# Patient Record
Sex: Female | Born: 1980 | Race: Black or African American | Hispanic: No | Marital: Single | Smoking: Current every day smoker
Health system: Southern US, Community
[De-identification: ages and names within clinical notes are randomized; demographics above are authoritative.]

## PROBLEM LIST (undated history)

## (undated) ENCOUNTER — Emergency Department: Payer: Self-pay

## (undated) DIAGNOSIS — O24419 Gestational diabetes mellitus in pregnancy, unspecified control: Secondary | ICD-10-CM

## (undated) DIAGNOSIS — Z789 Other specified health status: Secondary | ICD-10-CM

## (undated) HISTORY — PX: NO PAST SURGERIES: SHX2092

## (undated) HISTORY — DX: Gestational diabetes mellitus in pregnancy, unspecified control: O24.419

---

## 2004-03-29 ENCOUNTER — Emergency Department: Payer: Self-pay | Admitting: Emergency Medicine

## 2006-03-27 ENCOUNTER — Observation Stay: Payer: Self-pay | Admitting: Obstetrics and Gynecology

## 2006-05-12 ENCOUNTER — Observation Stay: Payer: Self-pay

## 2006-05-30 ENCOUNTER — Inpatient Hospital Stay: Payer: Self-pay | Admitting: Obstetrics and Gynecology

## 2012-01-18 ENCOUNTER — Emergency Department: Payer: Self-pay | Admitting: Emergency Medicine

## 2012-11-25 ENCOUNTER — Emergency Department: Payer: Self-pay | Admitting: Emergency Medicine

## 2012-11-25 LAB — URINALYSIS, COMPLETE
Bilirubin,UR: NEGATIVE
Glucose,UR: NEGATIVE mg/dL (ref 0–75)
Leukocyte Esterase: NEGATIVE
Ph: 5 (ref 4.5–8.0)
Specific Gravity: 1.023 (ref 1.003–1.030)

## 2012-11-25 LAB — CBC
HCT: 35.2 % (ref 35.0–47.0)
HGB: 11.7 g/dL — ABNORMAL LOW (ref 12.0–16.0)
MCH: 29.9 pg (ref 26.0–34.0)
MCHC: 33.1 g/dL (ref 32.0–36.0)
MCV: 90 fL (ref 80–100)
RBC: 3.9 10*6/uL (ref 3.80–5.20)

## 2013-04-29 ENCOUNTER — Emergency Department: Payer: Self-pay | Admitting: Emergency Medicine

## 2013-09-16 ENCOUNTER — Emergency Department: Payer: Self-pay | Admitting: Emergency Medicine

## 2013-11-21 ENCOUNTER — Emergency Department: Payer: Self-pay | Admitting: Emergency Medicine

## 2015-01-19 ENCOUNTER — Emergency Department: Payer: Medicaid Other

## 2015-01-19 ENCOUNTER — Encounter: Payer: Self-pay | Admitting: Emergency Medicine

## 2015-01-19 ENCOUNTER — Emergency Department
Admission: EM | Admit: 2015-01-19 | Discharge: 2015-01-19 | Disposition: A | Discharge status: Home / Self Care | Payer: Medicaid Other | Attending: Emergency Medicine | Admitting: Emergency Medicine

## 2015-01-19 DIAGNOSIS — Z720 Tobacco use: Secondary | ICD-10-CM | POA: Insufficient documentation

## 2015-01-19 DIAGNOSIS — M25572 Pain in left ankle and joints of left foot: Secondary | ICD-10-CM | POA: Diagnosis present

## 2015-01-19 DIAGNOSIS — M722 Plantar fascial fibromatosis: Secondary | ICD-10-CM

## 2015-01-19 DIAGNOSIS — M2141 Flat foot [pes planus] (acquired), right foot: Secondary | ICD-10-CM | POA: Insufficient documentation

## 2015-01-19 DIAGNOSIS — M2142 Flat foot [pes planus] (acquired), left foot: Secondary | ICD-10-CM | POA: Insufficient documentation

## 2015-01-19 DIAGNOSIS — L309 Dermatitis, unspecified: Secondary | ICD-10-CM | POA: Insufficient documentation

## 2015-01-19 MED ORDER — TRAMADOL HCL 50 MG PO TABS: 50.0000 mg | ORAL_TABLET | Freq: Four times a day (QID) | ORAL | Status: DC | PRN

## 2015-01-19 MED ORDER — NAPROXEN SODIUM 275 MG PO TABS: 275.0000 mg | ORAL_TABLET | Freq: Two times a day (BID) | ORAL | Status: AC

## 2015-01-19 NOTE — ED Notes (Signed)
Pt presents with left foot pain since Friday.

## 2015-01-19 NOTE — ED Provider Notes (Signed)
[facility] Emergency Department Provider Note  ____________________________________________  Time seen: Approximately 12:28 PM  I have reviewed the triage vital signs and the nursing notes.   HISTORY  Chief Complaint Foot Pain    HPI Shannon Ochoa is a 34 y.o. female 4 days of left foot pain.Patient denies any provocative incident. Patient states she's been soaking the foot in Epsom salt with mild relief. Patient states the pain is isn't 2 areas of of foot. Plantar heel pain and dorsal foot pain. Patient stated pain increases with ambulation.   History reviewed. No pertinent past medical history.  There are no active problems to display for this patient.   History reviewed. No pertinent past surgical history.  No current outpatient prescriptions on file.  Allergies Review of patient's allergies indicates no known allergies.  No family history on file.  Social History Social History  Substance Use Topics  . Smoking status: Current Some Day Smoker  . Smokeless tobacco: None  . Alcohol Use: No    Review of Systems Constitutional: No fever/chills Eyes: No visual changes. ENT: No sore throat. Cardiovascular: Denies chest pain. Respiratory: Denies shortness of breath. Gastrointestinal: No abdominal pain.  No nausea, no vomiting.  No diarrhea.  No constipation. Genitourinary: Negative for dysuria. Musculoskeletal: Left foot pain Skin: Negative for rash. Eczema left medial aspect of left foot Neurological: Negative for headaches, focal weakness or numbness. 10-point ROS otherwise negative.  ____________________________________________   PHYSICAL EXAM:  VITAL SIGNS: ED Triage Vitals  Enc Vitals Group     BP 01/19/15 1141 123/108 mmHg     Pulse Rate 01/19/15 1141 93     Resp 01/19/15 1141 20     Temp 01/19/15 1141 98.6 F (37 C)     Temp Source 01/19/15 1141 Oral     SpO2 01/19/15 1141 98 %     Weight 01/19/15 1139 202 lb (91.627  kg)     Height 01/19/15 1139 5\' 4"  (1.626 m)     Head Cir --      Peak Flow --      Pain Score 01/19/15 1140 8     Pain Loc --      Pain Edu? --      Excl. in GC? --     Constitutional: Alert and oriented. Well appearing and in no acute distress. Eyes: Conjunctivae are normal. PERRL. EOMI. Head: Atraumatic. Nose: No congestion/rhinnorhea. Mouth/Throat: Mucous membranes are moist.  Oropharynx non-erythematous. Neck: No stridor.   Cardiovascular: Normal rate, regular rhythm. Grossly normal heart sounds.  Good peripheral circulation. Mild elevation of diastolic blood pressure Respiratory: Normal respiratory effort.  No retractions. Lungs CTAB. Gastrointestinal: Soft and nontender. No distention. No abdominal bruits. No CVA tenderness. Musculoskeletal: Pes Planus. Moderate guarding with palpation plan aspect left heel and dorsal aspect of the left foot Neurologic:  Normal speech and language. No gross focal neurologic deficits are appreciated. No gait instability. Skin:  Skin is warm, dry and intact. No rash noted. Eczema lesion medial aspect the left foot Psychiatric: Mood and affect are normal. Speech and behavior are normal.  ____________________________________________   LABS (all labs ordered are listed, but only abnormal results are displayed)  Labs Reviewed - No data to display ____________________________________________  EKG   ____________________________________________  RADIOLOGY  Acute findings on x-ray. I, Joni Reining, personally viewed and evaluated these images (plain radiographs) as part of my medical decision making.   ____________________________________________   PROCEDURES  Procedure(s) performed: None  Critical Care  performed: No  ____________________________________________   INITIAL IMPRESSION / ASSESSMENT AND PLAN / ED COURSE  Pertinent labs & imaging results that were available during my care of the patient were reviewed by me and  considered in my medical decision making (see chart for details).  Plantar fasciitis. Patient advised to follow-up with podiatry for further evaluation and treatment. ____________________________________________   FINAL CLINICAL IMPRESSION(S) / ED DIAGNOSES  Final diagnoses:  Pes planus of both feet  Plantar fasciitis of left foot      Joni Reining, PA-C 01/19/15 1402  Arnaldo Natal, MD 01/19/15 1505

## 2015-01-19 NOTE — Discharge Instructions (Signed)
Plantar Fasciitis  Plantar fasciitis is a common condition that causes foot pain. It is soreness (inflammation) of the band of tough fibrous tissue on the bottom of the foot that runs from the heel bone (calcaneus) to the ball of the foot. The cause of this soreness may be from excessive standing, poor fitting shoes, running on hard surfaces, being overweight, having an abnormal walk, or overuse (this is common in runners) of the painful foot or feet. It is also common in aerobic exercise dancers and ballet dancers.  SYMPTOMS   Most people with plantar fasciitis complain of:   Severe pain in the morning on the bottom of their foot especially when taking the first steps out of bed. This pain recedes after a few minutes of walking.   Severe pain is experienced also during walking following a long period of inactivity.   Pain is worse when walking barefoot or up stairs  DIAGNOSIS    Your caregiver will diagnose this condition by examining and feeling your foot.   Special tests such as X-rays of your foot, are usually not needed.  PREVENTION    Consult a sports medicine professional before beginning a new exercise program.   Walking programs offer a good workout. With walking there is a lower chance of overuse injuries common to runners. There is less impact and less jarring of the joints.   Begin all new exercise programs slowly. If problems or pain develop, decrease the amount of time or distance until you are at a comfortable level.   Wear good shoes and replace them regularly.   Stretch your foot and the heel cords at the back of the ankle (Achilles tendon) both before and after exercise.   Run or exercise on even surfaces that are not hard. For example, asphalt is better than pavement.   Do not run barefoot on hard surfaces.   If using a treadmill, vary the incline.   Do not continue to workout if you have foot or joint problems. Seek professional help if they do not improve.  HOME CARE INSTRUCTIONS     Avoid activities that cause you pain until you recover.   Use ice or cold packs on the problem or painful areas after working out.   Only take over-the-counter or prescription medicines for pain, discomfort, or fever as directed by your caregiver.   Soft shoe inserts or athletic shoes with air or gel sole cushions may be helpful.   If problems continue or become more severe, consult a sports medicine caregiver or your own health care provider. Cortisone is a potent anti-inflammatory medication that may be injected into the painful area. You can discuss this treatment with your caregiver.  MAKE SURE YOU:    Understand these instructions.   Will watch your condition.   Will get help right away if you are not doing well or get worse.  Document Released: 01/17/2001 Document Revised: 07/17/2011 Document Reviewed: 03/18/2008  ExitCare Patient Information 2015 [employer]. This information is not intended to replace advice given to you by your health care provider. Make sure you discuss any questions you have with your health care provider.

## 2015-05-25 ENCOUNTER — Emergency Department
Admission: EM | Admit: 2015-05-25 | Discharge: 2015-05-25 | Disposition: A | Discharge status: Home / Self Care | Payer: Medicaid Other | Attending: Emergency Medicine | Admitting: Emergency Medicine

## 2015-05-25 DIAGNOSIS — F17200 Nicotine dependence, unspecified, uncomplicated: Secondary | ICD-10-CM | POA: Insufficient documentation

## 2015-05-25 DIAGNOSIS — R51 Headache: Secondary | ICD-10-CM | POA: Diagnosis present

## 2015-05-25 DIAGNOSIS — Z791 Long term (current) use of non-steroidal anti-inflammatories (NSAID): Secondary | ICD-10-CM | POA: Diagnosis not present

## 2015-05-25 DIAGNOSIS — I10 Essential (primary) hypertension: Secondary | ICD-10-CM | POA: Diagnosis not present

## 2015-05-25 LAB — COMPREHENSIVE METABOLIC PANEL
ALBUMIN: 4.1 g/dL (ref 3.5–5.0)
ALK PHOS: 50 U/L (ref 38–126)
ALT: 19 U/L (ref 14–54)
AST: 16 U/L (ref 15–41)
Anion gap: 5 (ref 5–15)
BUN: 9 mg/dL (ref 6–20)
CALCIUM: 8.8 mg/dL — AB (ref 8.9–10.3)
CO2: 27 mmol/L (ref 22–32)
Chloride: 107 mmol/L (ref 101–111)
Creatinine, Ser: 0.53 mg/dL (ref 0.44–1.00)
GFR calc Af Amer: >60 mL/min (ref >60)
GFR calc non Af Amer: >60 mL/min (ref >60)
GLUCOSE: 98 mg/dL (ref 65–99)
Potassium: 4 mmol/L (ref 3.5–5.1)
Sodium: 139 mmol/L (ref 135–145)
Total Bilirubin: 0.5 mg/dL (ref 0.3–1.2)
Total Protein: 7.3 g/dL (ref 6.5–8.1)

## 2015-05-25 LAB — URINALYSIS COMPLETE WITH MICROSCOPIC (ARMC ONLY)
BILIRUBIN URINE: NEGATIVE
GLUCOSE, UA: NEGATIVE mg/dL
Ketones, ur: NEGATIVE mg/dL
Nitrite: NEGATIVE
Protein, ur: NEGATIVE mg/dL
Specific Gravity, Urine: 1.017 (ref 1.005–1.030)
pH: 5 (ref 5.0–8.0)

## 2015-05-25 LAB — CBC WITH DIFFERENTIAL/PLATELET
BASOS ABS: 0.1 10*3/uL (ref 0–0.1)
Basophils Relative: 1 %
Eosinophils Absolute: 0.1 10*3/uL (ref 0–0.7)
Eosinophils Relative: 2 %
HEMATOCRIT: 36.9 % (ref 35.0–47.0)
Hemoglobin: 12 g/dL (ref 12.0–16.0)
LYMPHS ABS: 2.6 10*3/uL (ref 1.0–3.6)
LYMPHS PCT: 29 %
MCH: 29.7 pg (ref 26.0–34.0)
MCHC: 32.6 g/dL (ref 32.0–36.0)
MCV: 91.1 fL (ref 80.0–100.0)
MONO ABS: 0.5 10*3/uL (ref 0.2–0.9)
Monocytes Relative: 5 %
NEUTROS ABS: 5.9 10*3/uL (ref 1.4–6.5)
Neutrophils Relative %: 63 %
Platelets: 358 10*3/uL (ref 150–440)
RBC: 4.05 MIL/uL (ref 3.80–5.20)
RDW: 14 % (ref 11.5–14.5)
WBC: 9.2 10*3/uL (ref 3.6–11.0)

## 2015-05-25 MED ORDER — HYDROCHLOROTHIAZIDE 12.5 MG PO CAPS: 26.0000 mg | ORAL_CAPSULE | Freq: Every day | ORAL | Status: DC

## 2015-05-25 NOTE — ED Notes (Signed)
Pt reports went to the health department today to get her Depo shot and was told to come to the ED to get medication for high blood pressure. Pt BP at health department was 157/127. Pt reports has a slight HA but denies all other sx's. Reports no hx of HTN herself but has family hx of it.

## 2015-05-25 NOTE — Discharge Instructions (Signed)
Advised to have blood pressure taken twice a day for 3 days 1 week after starting medication. How to Take Your Blood Pressure HOW DO I GET A BLOOD PRESSURE MACHINE?  You can buy an electronic home blood pressure machine at your local pharmacy. Insurance will sometimes cover the cost if you have a prescription.  Ask your doctor what type of machine is best for you. There are different machines for your arm and your wrist.  If you decide to buy a machine to check your blood pressure on your arm, first check the size of your arm so you can buy the right size cuff. To check the size of your arm:   Use a measuring tape that shows both inches and centimeters.   Wrap the measuring tape around the upper-middle part of your arm. You may need someone to help you measure.   Write down your arm measurement in both inches and centimeters.   To measure your blood pressure correctly, it is important to have the right size cuff.   If your arm is up to 13 inches (up to 34 centimeters), get an adult cuff size.  If your arm is 13 to 17 inches (35 to 44 centimeters), get a large adult cuff size.    If your arm is 17 to 20 inches (45 to 52 centimeters), get an adult thigh cuff.  WHAT DO THE NUMBERS MEAN?   There are two numbers that make up your blood pressure. For example: 120/80.  The first number (120 in our example) is called the "systolic pressure." It is a measure of the pressure in your blood vessels when your heart is pumping blood.  The second number (80 in our example) is called the "diastolic pressure." It is a measure of the pressure in your blood vessels when your heart is resting between beats.  Your doctor will tell you what your blood pressure should be. WHAT SHOULD I DO BEFORE I CHECK MY BLOOD PRESSURE?   Try to rest or relax for at least 30 minutes before you check your blood pressure.  Do not smoke.  Do not have any drinks with caffeine, such  as:  Soda.  Coffee.  Tea.  Check your blood pressure in a quiet room.  Sit down and stretch out your arm on a table. Keep your arm at about the level of your heart. Let your arm relax.  Make sure that your legs are not crossed. HOW DO I CHECK MY BLOOD PRESSURE?  Follow the directions that came with your machine.  Make sure you remove any tight-fitting clothing from your arm or wrist. Wrap the cuff around your upper arm or wrist. You should be able to fit a finger between the cuff and your arm. If you cannot fit a finger between the cuff and your arm, it is too tight and should be removed and rewrapped.  Some units require you to manually pump up the arm cuff.  Automatic units inflate the cuff when you press a button.  Cuff deflation is automatic in both models.  After the cuff is inflated, the unit measures your blood pressure and pulse. The readings are shown on a monitor. Hold still and breathe normally while the cuff is inflated.  Getting a reading takes less than a minute.  Some models store readings in a memory. Some provide a printout of readings. If your machine does not store your readings, keep a written record.  Take readings with you to your  next visit with your doctor.   This information is not intended to replace advice given to you by your health care provider. Make sure you discuss any questions you have with your health care provider.   Document Released: 04/06/2008 Document Revised: 05/15/2014 Document Reviewed: 06/19/2013 Elsevier Interactive Patient Education 2016 [employer].  Hypertension Hypertension, commonly called high blood pressure, is when the force of blood pumping through your arteries is too strong. Your arteries are the blood vessels that carry blood from your heart throughout your body. A blood pressure reading consists of a higher number over a lower number, such as 110/72. The higher number (systolic) is the pressure inside your arteries  when your heart pumps. The lower number (diastolic) is the pressure inside your arteries when your heart relaxes. Ideally you want your blood pressure below 120/80. Hypertension forces your heart to work harder to pump blood. Your arteries may become narrow or stiff. Having untreated or uncontrolled hypertension can cause heart attack, stroke, kidney disease, and other problems. RISK FACTORS Some risk factors for high blood pressure are controllable. Others are not.  Risk factors you cannot control include:   Race. You may be at higher risk if you are African American.  Age. Risk increases with age.  Gender. Men are at higher risk than women before age 74 years. After age 79, women are at higher risk than men. Risk factors you can control include:  Not getting enough exercise or physical activity.  Being overweight.  Getting too much fat, sugar, calories, or salt in your diet.  Drinking too much alcohol. SIGNS AND SYMPTOMS Hypertension does not usually cause signs or symptoms. Extremely high blood pressure (hypertensive crisis) may cause headache, anxiety, shortness of breath, and nosebleed. DIAGNOSIS To check if you have hypertension, your health care provider will measure your blood pressure while you are seated, with your arm held at the level of your heart. It should be measured at least twice using the same arm. Certain conditions can cause a difference in blood pressure between your right and left arms. A blood pressure reading that is higher than normal on one occasion does not mean that you need treatment. If it is not clear whether you have high blood pressure, you may be asked to return on a different day to have your blood pressure checked again. Or, you may be asked to monitor your blood pressure at home for 1 or more weeks. TREATMENT Treating high blood pressure includes making lifestyle changes and possibly taking medicine. Living a healthy lifestyle can help lower high blood  pressure. You may need to change some of your habits. Lifestyle changes may include:  Following the DASH diet. This diet is high in fruits, vegetables, and whole grains. It is low in salt, red meat, and added sugars.  Keep your sodium intake below 2,300 mg per day.  Getting at least 30-45 minutes of aerobic exercise at least 4 times per week.  Losing weight if necessary.  Not smoking.  Limiting alcoholic beverages.  Learning ways to reduce stress. Your health care provider may prescribe medicine if lifestyle changes are not enough to get your blood pressure under control, and if one of the following is true:  You are 72-70 years of age and your systolic blood pressure is above 140.  You are 53 years of age or older, and your systolic blood pressure is above 150.  Your diastolic blood pressure is above 90.  You have diabetes, and your systolic blood  pressure is over 140 or your diastolic blood pressure is over 90.  You have kidney disease and your blood pressure is above 140/90.  You have heart disease and your blood pressure is above 140/90. Your personal target blood pressure may vary depending on your medical conditions, your age, and other factors. HOME CARE INSTRUCTIONS  Have your blood pressure rechecked as directed by your health care provider.   Take medicines only as directed by your health care provider. Follow the directions carefully. Blood pressure medicines must be taken as prescribed. The medicine does not work as well when you skip doses. Skipping doses also puts you at risk for problems.  Do not smoke.   Monitor your blood pressure at home as directed by your health care provider. SEEK MEDICAL CARE IF:   You think you are having a reaction to medicines taken.  You have recurrent headaches or feel dizzy.  You have swelling in your ankles.  You have trouble with your vision. SEEK IMMEDIATE MEDICAL CARE IF:  You develop a severe headache or  confusion.  You have unusual weakness, numbness, or feel faint.  You have severe chest or abdominal pain.  You vomit repeatedly.  You have trouble breathing. MAKE SURE YOU:   Understand these instructions.  Will watch your condition.  Will get help right away if you are not doing well or get worse.   This information is not intended to replace advice given to you by your health care provider. Make sure you discuss any questions you have with your health care provider.   Document Released: 04/24/2005 Document Revised: 09/08/2014 Document Reviewed: 02/14/2013 Elsevier Interactive Patient Education [employer]

## 2015-05-25 NOTE — ED Notes (Signed)
States she went to health dept for depo shot   Noticed that her b/p was elevated   Hx of same during her pregnancies  Denies any sx's

## 2015-05-25 NOTE — ED Provider Notes (Signed)
[facility] Emergency Department Provider Note  ____________________________________________  Time seen: Approximately 10:06 AM  I have reviewed the triage vital signs and the nursing notes.   HISTORY  Chief Complaint Hypertension    HPI Shannon Ochoa is a 35 y.o. African American female presenting to the ED today with complaint of high BP after she went to health department this morning for her Depo shot. She reports they gave her the shot but told her she needed to be seen by a doctor today because her BP was too high at 157/127 and again taken at 167/127. She has a history of gestational hypertension with two pregnancies and significant family history of HTN. She states the health department has told her for a couple of years her BP was too high but was not hypertensive when seen by her PCP. She is a current "some day" smoker with a BMI of 39. States headache for a couple of months now and occasional dizziness. Denies blurry vision, CP, palpitations, SOB, DOE, or edema.   No past medical history on file.  There are no active problems to display for this patient.   No past surgical history on file.  Current Outpatient Rx  Name  Route  Sig  Dispense  Refill  . hydrochlorothiazide (MICROZIDE) 12.5 MG capsule   Oral   Take 2 capsules (25 mg total) by mouth daily.   60 capsule   0   . naproxen sodium (ANAPROX) 275 MG tablet   Oral   Take 1 tablet (275 mg total) by mouth 2 (two) times daily with a meal.   30 tablet   2   . traMADol (ULTRAM) 50 MG tablet   Oral   Take 1 tablet (50 mg total) by mouth every 6 (six) hours as needed for moderate pain.   12 tablet   0     Allergies Review of patient's allergies indicates no known allergies.  No family history on file.  Social History Social History  Substance Use Topics  . Smoking status: Current Some Day Smoker  . Smokeless tobacco: Not on file  . Alcohol Use: No    Review of  Systems  Constitutional: occasional dizziness Eyes: No visual changes or blurry vision ENT: No rhinorrhea or sore throat Cardiovascular: Denies chest pain, palpitations, DOE, leg edema Respiratory: Denies shortness of breath. Gastrointestinal: No abdominal pain.  No nausea, no vomiting.  No diarrhea.  No constipation. Genitourinary: Negative for dysuria. Musculoskeletal: Negative for back pain. Skin: Negative for rash. Neurological: positive for occasional headaches. Negative for focal weakness or numbness. 10-point ROS otherwise negative.  ____________________________________________   PHYSICAL EXAM:  VITAL SIGNS: ED Triage Vitals  Enc Vitals Group     BP 05/25/15 0913 177/117 mmHg     Pulse Rate 05/25/15 0913 72     Resp 05/25/15 0913 20     Temp 05/25/15 0913 97.6 F (36.4 C)     Temp Source 05/25/15 0913 Oral     SpO2 05/25/15 0913 100 %     Weight 05/25/15 0913 213 lb (51.256 kg)     Height 05/25/15 0913  (1.575 m)     Head Cir --      Peak Flow --      Pain Score 05/25/15 0913 3     Pain Loc --      Pain Edu? --      Excl. in GC? --     Constitutional: Alert and oriented. Well appearing  and in no acute distress. Eyes: Conjunctivae are normal. PERRL. EOMI. Head: Atraumatic. Nose: No congestion/rhinnorhea. Mouth/Throat: Mucous membranes are moist.  Oropharynx non-erythematous. Neck: No stridor.   Cardiovascular: Normal rate, regular rhythm. Grossly normal heart sounds.  Good peripheral circulation. BP was 177/117 when triaged. Recorded one hour later at 152/114.  Respiratory: Normal respiratory effort.  No retractions. Lungs CTAB. Gastrointestinal: Soft and nontender. No distention. No abdominal bruits. No CVA tenderness. Musculoskeletal: No lower extremity tenderness nor edema.  No joint effusions. Neurologic:  Normal speech and language. No gross focal neurologic deficits are appreciated. No gait instability. Skin:  Skin is warm, dry and intact. No rash  noted. Psychiatric: Mood and affect are normal. Speech and behavior are normal.  ____________________________________________   LABS (all labs ordered are listed, but only abnormal results are displayed)  Labs Reviewed  URINALYSIS COMPLETEWITH MICROSCOPIC ([facility] ONLY) - Abnormal; Notable for the following:    Color, Urine YELLOW (*)    APPearance CLEAR (*)    Hgb urine dipstick 2+ (*)    Leukocytes, UA TRACE (*)    Bacteria, UA RARE (*)    Squamous Epithelial / LPF 0-5 (*)    All other components within normal limits  COMPREHENSIVE METABOLIC PANEL - Abnormal; Notable for the following:    Calcium 8.8 (*)    All other components within normal limits  CBC WITH DIFFERENTIAL/PLATELET   ____________________________________________  EKG   ____________________________________________  RADIOLOGY   ____________________________________________   PROCEDURES  Procedure(s) performed: None  Critical Care performed: No  ____________________________________________   INITIAL IMPRESSION / ASSESSMENT AND PLAN / ED COURSE  Pertinent labs & imaging results that were available during my care of the patient were reviewed by me and considered in my medical decision making (see chart for details).  Hypertension. Discuss lab results with patient. Patient advised to follow for family doctor for continued care. Patient will be started on HCTZ 25 mg daily pending reevaluation of blood pressure readings in one week. Advise three-day blood pressure reading in one week with 2 readings per day. ____________________________________________   FINAL CLINICAL IMPRESSION(S) / ED DIAGNOSES  Final diagnoses:  Essential hypertension      Joni Reining, PA-C 05/25/15 1148  Rockne Menghini, MD 05/25/15 [phone]

## 2016-02-10 ENCOUNTER — Encounter: Payer: Self-pay | Admitting: Emergency Medicine

## 2016-02-10 ENCOUNTER — Emergency Department
Admission: EM | Admit: 2016-02-10 | Discharge: 2016-02-10 | Disposition: A | Discharge status: Home / Self Care | Payer: Medicaid Other | Attending: Emergency Medicine | Admitting: Emergency Medicine

## 2016-02-10 DIAGNOSIS — H578 Other specified disorders of eye and adnexa: Secondary | ICD-10-CM | POA: Diagnosis present

## 2016-02-10 DIAGNOSIS — F17200 Nicotine dependence, unspecified, uncomplicated: Secondary | ICD-10-CM | POA: Diagnosis not present

## 2016-02-10 DIAGNOSIS — H00014 Hordeolum externum left upper eyelid: Secondary | ICD-10-CM | POA: Insufficient documentation

## 2016-02-10 DIAGNOSIS — Z791 Long term (current) use of non-steroidal anti-inflammatories (NSAID): Secondary | ICD-10-CM | POA: Diagnosis not present

## 2016-02-10 LAB — CBC WITH DIFFERENTIAL/PLATELET
BASOS ABS: 0.1 10*3/uL (ref 0–0.1)
BASOS PCT: 1 %
Eosinophils Absolute: 0.3 10*3/uL (ref 0–0.7)
Eosinophils Relative: 2 %
HEMATOCRIT: 41.2 % (ref 35.0–47.0)
HEMOGLOBIN: 13.4 g/dL (ref 12.0–16.0)
Lymphocytes Relative: 21 %
Lymphs Abs: 2.9 10*3/uL (ref 1.0–3.6)
MCH: 29.9 pg (ref 26.0–34.0)
MCHC: 32.6 g/dL (ref 32.0–36.0)
MCV: 91.6 fL (ref 80.0–100.0)
Monocytes Absolute: 1 10*3/uL — ABNORMAL HIGH (ref 0.2–0.9)
Monocytes Relative: 8 %
NEUTROS ABS: 9.5 10*3/uL — AB (ref 1.4–6.5)
NEUTROS PCT: 68 %
Platelets: 291 10*3/uL (ref 150–440)
RBC: 4.49 MIL/uL (ref 3.80–5.20)
RDW: 13.9 % (ref 11.5–14.5)
WBC: 13.8 10*3/uL — ABNORMAL HIGH (ref 3.6–11.0)

## 2016-02-10 MED ORDER — ERYTHROMYCIN 5 MG/GM OP OINT: TOPICAL_OINTMENT | Freq: Three times a day (TID) | OPHTHALMIC | 0 refills | Status: AC

## 2016-02-10 MED ORDER — IBUPROFEN 800 MG PO TABS
800.0000 mg | ORAL_TABLET | Freq: Once | ORAL | Status: AC
  Administered 2016-02-10: 800 mg via ORAL
  Filled 2016-02-10: qty 1

## 2016-02-10 MED ORDER — CEPHALEXIN 500 MG PO CAPS: 500.0000 mg | ORAL_CAPSULE | Freq: Four times a day (QID) | ORAL | 0 refills | Status: DC

## 2016-02-10 NOTE — ED Triage Notes (Signed)
Having swelling to left eye   States swelling to eye started last week..Marland Kitchen

## 2016-02-10 NOTE — ED Provider Notes (Signed)
[facility] Emergency Department Provider Note   ____________________________________________   First MD Initiated Contact with Patient 02/10/16 1355     (approximate)  I have reviewed the triage vital signs and the nursing notes.   HISTORY  Chief Complaint Facial Swelling   HPI Shannon Ochoa is a 35 y.o. female who presents with swelling to left eyelid x6 days. Patient reports pain in right eyelid that has worsened since onset, pain is throbbing in nature. Swelling has also worsened since onset and sometimes obstructs vision. Denies trauma, visual change, eye redness, eye drainage. Patient gets styes, but is usually able to resolve them on her own with warm compresses. Patient has tried warm compresses and epsom salts and has not been able to express any drainage. Patient also reports myalgias, high fevers and left ear pain. Patient states fevers have been up to 104 and 101. Denies nausea, vomiting, sore throat, nasal congestion, cough.    History reviewed. No pertinent past medical history.  There are no active problems to display for this patient.   History reviewed. No pertinent surgical history.  Prior to Admission medications   Medication Sig Start Date End Date Taking? Authorizing Provider  cephALEXin (KEFLEX) 500 MG capsule Take 1 capsule (500 mg total) by mouth 4 (four) times daily. 02/10/16   Charmayne Sheerharles M Beers, PA-C  erythromycin [facility](ROMYCIN) ophthalmic ointment Place into the right eye 3 (three) times daily. Place a 1/2 inch ribbon of ointment into the lower eyelid. 02/10/16 02/20/16  Charmayne Sheerharles M Beers, PA-C  hydrochlorothiazide (MICROZIDE) 12.5 MG capsule Take 2 capsules (25 mg total) by mouth daily. 05/25/15 05/24/16  Joni Reiningonald K Smith, PA-C  traMADol (ULTRAM) 50 MG tablet Take 1 tablet (50 mg total) by mouth every 6 (six) hours as needed for moderate pain. 01/19/15   Joni Reiningonald K Smith, PA-C    Allergies Review of patient's allergies indicates no known  allergies.  No family history on file.  Social History Social History  Substance Use Topics  . Smoking status: Current Some Day Smoker  . Smokeless tobacco: Never Used  . Alcohol use No    Review of Systems Constitutional: Positive for fever and fatigue.  Eyes: No visual changes, red eye, eye drainage. Swelling and pain to left eyelid. ENT: No sore throat, nasal congestion. Positive for left ear pain.  Cardiovascular: Denies chest pain. Respiratory: Denies shortness of breath. Gastrointestinal: No abdominal pain.  No nausea, no vomiting.  Musculoskeletal: Negative for neck/back pain. Skin: Negative for rash. Neurological: Negative for focal weakness or numbness. Positive for headache.  ____________________________________________   PHYSICAL EXAM:  VITAL SIGNS: ED Triage Vitals  Enc Vitals Group     BP 02/10/16 1251 120/73     Pulse Rate 02/10/16 1251 (!) 105     Resp 02/10/16 1251 18     Temp 02/10/16 1251 98.4 F (36.9 C)     Temp Source 02/10/16 1251 Oral     SpO2 02/10/16 1251 97 %     Weight --      Height --      Head Circumference --      Peak Flow --      Pain Score 02/10/16 1242 7     Pain Loc --      Pain Edu? --      Excl. in GC? --     Constitutional: Alert and oriented. Well appearing and in no acute distress. Eyes: Left upper lid with moderate swelling and erythema, mildly tender  to palpation, small punctate opening noted medially without drainage. Sclera clear without icterus bilaterally. PERRL. EOMI. No tenderness to palpation of left orbital bones.  Head: Atraumatic. Ears: Canals and TMs normal bilaterally. No tenderness on manipulation of tragus or pinna bilaterally.  Nose: No congestion/rhinnorhea. Mouth/Throat: Mucous membranes are moist.  Oropharynx non-erythematous and without exudate. No TMJ tenderness or tenderness along the left jaw. Neck: No stridor. Supple, full ROM without pain or difficulty. Hematological/Lymphatic/Immunilogical: No  cervical lymphadenopathy. Cardiovascular: Normal rate, regular rhythm. Grossly normal heart sounds.  Good peripheral circulation. Respiratory: Normal respiratory effort.  No retractions. Lungs CTAB. Musculoskeletal: Full ROM in all extremities without pain or difficulty. Neurologic:  Normal speech and language. No gross focal neurologic deficits are appreciated.  Skin:  Skin is warm, dry and intact. No rash noted. Psychiatric: Mood and affect are normal. Speech and behavior are normal.  ____________________________________________   LABS (all labs ordered are listed, but only abnormal results are displayed)  Labs Reviewed  CBC WITH DIFFERENTIAL/PLATELET - Abnormal; Notable for the following:       Result Value   WBC 13.8 (*)    Neutro Abs 9.5 (*)    Monocytes Absolute 1.0 (*)    All other components within normal limits   ____________________________________________  EKG  None. ____________________________________________  RADIOLOGY  None. ____________________________________________   PROCEDURES  Procedure(s) performed: None  Procedures  Critical Care performed: No  ____________________________________________   INITIAL IMPRESSION / ASSESSMENT AND PLAN / ED COURSE  Pertinent labs & imaging results that were available during my care of the patient were reviewed by me and considered in my medical decision making (see chart for details).  Patient presentation consistent with hordeolum of the left upper lid. Patient given 800 mg PO ibuprofen in the ED and tolerated well. Patient given prescriptions for keflex and erythromycin ophthalmic ointment. Patient to follow up with ophthalmology if symptoms worsen or fail to improve. No other emergency medicine complaints at this time.   Clinical Course     ____________________________________________   FINAL CLINICAL IMPRESSION(S) / ED DIAGNOSES  Final diagnoses:  Hordeolum externum left upper eyelid      NEW  MEDICATIONS STARTED DURING THIS VISIT:  New Prescriptions   CEPHALEXIN (KEFLEX) 500 MG CAPSULE    Take 1 capsule (500 mg total) by mouth 4 (four) times daily.   ERYTHROMYCIN (ROMYCIN) OPHTHALMIC OINTMENT    Place into the right eye 3 (three) times daily. Place a 1/2 inch ribbon of ointment into the lower eyelid.     Note:  This document was prepared using Dragon voice recognition software and may include unintentional dictation errors.   Evangeline Dakin, PA-C 02/10/16 1600    Jeanmarie Plant, MD 02/10/16 [phone]

## 2016-04-13 ENCOUNTER — Encounter: Payer: Self-pay | Admitting: *Deleted

## 2016-04-13 ENCOUNTER — Emergency Department
Admission: EM | Admit: 2016-04-13 | Discharge: 2016-04-13 | Disposition: A | Discharge status: Home / Self Care | Payer: Medicaid Other | Attending: Emergency Medicine | Admitting: Emergency Medicine

## 2016-04-13 DIAGNOSIS — Y999 Unspecified external cause status: Secondary | ICD-10-CM | POA: Diagnosis not present

## 2016-04-13 DIAGNOSIS — Z5321 Procedure and treatment not carried out due to patient leaving prior to being seen by health care provider: Secondary | ICD-10-CM | POA: Diagnosis not present

## 2016-04-13 DIAGNOSIS — F17200 Nicotine dependence, unspecified, uncomplicated: Secondary | ICD-10-CM | POA: Insufficient documentation

## 2016-04-13 DIAGNOSIS — X58XXXA Exposure to other specified factors, initial encounter: Secondary | ICD-10-CM | POA: Insufficient documentation

## 2016-04-13 DIAGNOSIS — Y939 Activity, unspecified: Secondary | ICD-10-CM | POA: Diagnosis not present

## 2016-04-13 DIAGNOSIS — Y929 Unspecified place or not applicable: Secondary | ICD-10-CM | POA: Insufficient documentation

## 2016-04-13 DIAGNOSIS — S8992XA Unspecified injury of left lower leg, initial encounter: Secondary | ICD-10-CM | POA: Diagnosis not present

## 2016-04-13 NOTE — ED Notes (Signed)
Attempted to call patient on home phone with no answer.

## 2016-04-13 NOTE — ED Triage Notes (Signed)
Pt got out of car and injured left knee

## 2016-04-13 NOTE — ED Notes (Signed)
Patient called in flex wait and main wait with no answer.

## 2016-11-18 ENCOUNTER — Emergency Department
Admission: EM | Admit: 2016-11-18 | Discharge: 2016-11-18 | Disposition: A | Discharge status: Home / Self Care | Payer: Medicaid Other | Attending: Emergency Medicine | Admitting: Emergency Medicine

## 2016-11-18 ENCOUNTER — Encounter: Payer: Self-pay | Admitting: Emergency Medicine

## 2016-11-18 DIAGNOSIS — J029 Acute pharyngitis, unspecified: Secondary | ICD-10-CM | POA: Diagnosis present

## 2016-11-18 DIAGNOSIS — N3001 Acute cystitis with hematuria: Secondary | ICD-10-CM | POA: Insufficient documentation

## 2016-11-18 DIAGNOSIS — J020 Streptococcal pharyngitis: Secondary | ICD-10-CM | POA: Insufficient documentation

## 2016-11-18 DIAGNOSIS — F17200 Nicotine dependence, unspecified, uncomplicated: Secondary | ICD-10-CM | POA: Diagnosis not present

## 2016-11-18 LAB — URINALYSIS, ROUTINE W REFLEX MICROSCOPIC
Bilirubin Urine: NEGATIVE
GLUCOSE, UA: NEGATIVE mg/dL
KETONES UR: NEGATIVE mg/dL
Nitrite: NEGATIVE
PROTEIN: 100 mg/dL — AB
Specific Gravity, Urine: 1.024 (ref 1.005–1.030)
pH: 5 (ref 5.0–8.0)

## 2016-11-18 LAB — POCT PREGNANCY, URINE: PREG TEST UR: NEGATIVE

## 2016-11-18 MED ORDER — CEPHALEXIN 500 MG PO CAPS: 500.0000 mg | ORAL_CAPSULE | Freq: Four times a day (QID) | ORAL | 0 refills | Status: AC

## 2016-11-18 MED ORDER — ACETAMINOPHEN 325 MG PO TABS
650.0000 mg | ORAL_TABLET | Freq: Once | ORAL | Status: AC
  Administered 2016-11-18: 650 mg via ORAL
  Filled 2016-11-18: qty 2

## 2016-11-18 NOTE — ED Triage Notes (Signed)
L earache, cough and sore throat since yesterday.

## 2016-11-18 NOTE — ED Provider Notes (Signed)
[facility] Emergency Department Provider Note  ____________________________________________  Time seen: Approximately 5:09 PM  I have reviewed the triage vital signs and the nursing notes.   HISTORY  Chief Complaint Otalgia    HPI Shannon Ochoa is a 36 y.o. female patient presents to the emergency department with pharyngitis  nausea, headache and anterior neck discomfort for the past 3 days. Patient secondarily reports dysuria for approximately one day. She states that she is due to start her menstrual cycle. She denies changes in vaginal discharge, bilateral flank pain, history of pyelonephritis, nausea, vomiting or abdominal pain. No alleviating measures have been attempted.  History reviewed. No pertinent past medical history.  There are no active problems to display for this patient.   History reviewed. No pertinent surgical history.  Prior to Admission medications   Medication Sig Start Date End Date Taking? Authorizing Provider  cephALEXin (KEFLEX) 500 MG capsule Take 1 capsule (500 mg total) by mouth 4 (four) times daily. 11/18/16 11/28/16  Orvil Feil, PA-C  hydrochlorothiazide (MICROZIDE) 12.5 MG capsule Take 2 capsules (25 mg total) by mouth daily. 05/25/15 05/24/16  Joni Reining, PA-C  traMADol (ULTRAM) 50 MG tablet Take 1 tablet (50 mg total) by mouth every 6 (six) hours as needed for moderate pain. 01/19/15   Joni Reining, PA-C    Allergies Patient has no known allergies.  No family history on file.  Social History Social History  Substance Use Topics  . Smoking status: Current Some Day Smoker  . Smokeless tobacco: Never Used  . Alcohol use No     Review of Systems  Constitutional: No fever/chills Eyes: No visual changes. No discharge ENT: Patient has pharyngitis and left otalgia. Cardiovascular: no chest pain. Respiratory: no cough. No SOB. Gastrointestinal: No abdominal pain.  No nausea, no vomiting.  No diarrhea.  No  constipation. Genitourinary:  Patient has dysuria. Musculoskeletal: Negative for musculoskeletal pain. Skin: Negative for rash, abrasions, lacerations, ecchymosis. Neurological: patient has headache, no focal weakness or numbness.   ____________________________________________   PHYSICAL EXAM:  VITAL SIGNS: ED Triage Vitals  Enc Vitals Group     BP 11/18/16 1541 (!) 142/84     Pulse Rate 11/18/16 1541 (!) 112     Resp 11/18/16 1541 18     Temp 11/18/16 1541 (!) 100.4 F (38 C)     Temp Source 11/18/16 1541 Oral     SpO2 11/18/16 1541 96 %     Weight 11/18/16 1542 210 lb (95.3 kg)     Height 11/18/16 1542 5\' 4"  (1.626 m)     Head Circumference --      Peak Flow --      Pain Score 11/18/16 1541 8     Pain Loc --      Pain Edu? --      Excl. in GC? --      Constitutional: Alert and oriented. Well appearing and in no acute distress. Eyes: Conjunctivae are normal. PERRL. EOMI. Head: Atraumatic. ENT:      Ears: Tympanic membranes are pearly bilaterally.      Nose: No congestion/rhinnorhea.      Mouth/Throat: Mucous membranes are moist. Posterior pharynx is erythematous with bilateral tonsillar hypertrophy and exudate. Hematological/Lymphatic/Immunilogical: Palpable cervical lymphadenopathy Cardiovascular: Normal rate, regular rhythm. Normal S1 and S2.  Good peripheral circulation. Respiratory: Normal respiratory effort without tachypnea or retractions. Lungs CTAB. Good air entry to the bases with no decreased or absent breath sounds. Gastrointestinal: Bowel sounds 4  quadrants. Soft and nontender to palpation. No guarding or rigidity. No palpable masses. No distention. No CVA tenderness. Psychiatric: Mood and affect are normal. Speech and behavior are normal. Patient exhibits appropriate insight and judgement.   ____________________________________________   LABS (all labs ordered are listed, but only abnormal results are displayed)  Labs Reviewed  URINALYSIS, ROUTINE W  REFLEX MICROSCOPIC - Abnormal; Notable for the following:       Result Value   Color, Urine AMBER (*)    APPearance CLOUDY (*)    Hgb urine dipstick MODERATE (*)    Protein, ur 100 (*)    Leukocytes, UA MODERATE (*)    Bacteria, UA RARE (*)    Squamous Epithelial / LPF 0-5 (*)    All other components within normal limits  POC URINE PREG, ED  POCT PREGNANCY, URINE   ____________________________________________  EKG   ____________________________________________  RADIOLOGY   No results found.  ____________________________________________    PROCEDURES  Procedure(s) performed:    Procedures    Medications - No data to display   ____________________________________________   INITIAL IMPRESSION / ASSESSMENT AND PLAN / ED COURSE  Pertinent labs & imaging results that were available during my care of the patient were reviewed by me and considered in my medical decision making (see chart for details).  Review of the [place] CSRS was performed in accordance of the [facility] prior to dispensing any controlled drugs.    Assessment and plan: Strep pharyngitis Acute cystitis Patient presents to the emergency department with an erythematous posterior pharynx with tonsillar exudate, palpable cervical lymphadenopathy, fever and absence of cough. History and physical exam findings are consistent with strep pharyngitis. Patient also presents to the emergency department with dysuria. Urinalysis findings are concerning for acute cystitis. As patient does not have a history of nephrolithiasis and is without CVA tenderness, CT renal stone study is not warranted at this time. Patient was discharged with Keflex. She was advised to follow up with primary care in 1 week. Patient voiced understanding. Strict return precautions were given. All patient questions were answered.    ____________________________________________  FINAL CLINICAL IMPRESSION(S) / ED DIAGNOSES  Final diagnoses:  Strep  pharyngitis  Acute cystitis with hematuria      NEW MEDICATIONS STARTED DURING THIS VISIT:  New Prescriptions   CEPHALEXIN (KEFLEX) 500 MG CAPSULE    Take 1 capsule (500 mg total) by mouth 4 (four) times daily.        This chart was dictated using voice recognition software/Dragon. Despite best efforts to proofread, errors can occur which can change the meaning. Any change was purely unintentional.    Orvil FeilWoods, Giorgia Wahler M, PA-C 11/18/16 2333    Rockne MenghiniNorman, Anne-Caroline, MD 11/21/16 [phone]

## 2016-11-18 NOTE — ED Notes (Signed)
See triage note  States she developed fever ,sore throat and ear pain couple of days ago  Unsure of what her fever was at home but is febrile on arrival  Also having some urinary freq and pain

## 2017-05-15 DIAGNOSIS — R87619 Unspecified abnormal cytological findings in specimens from cervix uteri: Secondary | ICD-10-CM | POA: Insufficient documentation

## 2017-06-13 ENCOUNTER — Ambulatory Visit: Payer: Self-pay | Attending: Oncology

## 2018-03-30 ENCOUNTER — Emergency Department: Payer: Self-pay

## 2018-03-30 ENCOUNTER — Other Ambulatory Visit: Payer: Self-pay

## 2018-03-30 ENCOUNTER — Emergency Department
Admission: EM | Admit: 2018-03-30 | Discharge: 2018-03-30 | Disposition: A | Discharge status: Home / Self Care | Payer: Self-pay | Attending: Emergency Medicine | Admitting: Emergency Medicine

## 2018-03-30 DIAGNOSIS — J069 Acute upper respiratory infection, unspecified: Secondary | ICD-10-CM

## 2018-03-30 DIAGNOSIS — R519 Headache, unspecified: Secondary | ICD-10-CM

## 2018-03-30 DIAGNOSIS — R51 Headache: Secondary | ICD-10-CM | POA: Insufficient documentation

## 2018-03-30 DIAGNOSIS — F17210 Nicotine dependence, cigarettes, uncomplicated: Secondary | ICD-10-CM | POA: Insufficient documentation

## 2018-03-30 DIAGNOSIS — Z79899 Other long term (current) drug therapy: Secondary | ICD-10-CM | POA: Insufficient documentation

## 2018-03-30 LAB — CBC WITH DIFFERENTIAL/PLATELET
Abs Immature Granulocytes: 0.05 10*3/uL (ref 0.00–0.07)
Basophils Absolute: 0.1 10*3/uL (ref 0.0–0.1)
Basophils Relative: 1 %
EOS ABS: 0 10*3/uL (ref 0.0–0.5)
EOS PCT: 0 %
HEMATOCRIT: 40.3 % (ref 36.0–46.0)
Hemoglobin: 13.2 g/dL (ref 12.0–15.0)
Immature Granulocytes: 1 %
LYMPHS ABS: 3 10*3/uL (ref 0.7–4.0)
Lymphocytes Relative: 32 %
MCH: 29.9 pg (ref 26.0–34.0)
MCHC: 32.8 g/dL (ref 30.0–36.0)
MCV: 91.2 fL (ref 80.0–100.0)
Monocytes Absolute: 0.6 10*3/uL (ref 0.1–1.0)
Monocytes Relative: 7 %
Neutro Abs: 5.7 10*3/uL (ref 1.7–7.7)
Neutrophils Relative %: 59 %
Platelets: 326 10*3/uL (ref 150–400)
RBC: 4.42 MIL/uL (ref 3.87–5.11)
RDW: 13.4 % (ref 11.5–15.5)
WBC: 9.4 10*3/uL (ref 4.0–10.5)
nRBC: 0 % (ref 0.0–0.2)

## 2018-03-30 LAB — URINALYSIS, COMPLETE (UACMP) WITH MICROSCOPIC
Bilirubin Urine: NEGATIVE
Glucose, UA: NEGATIVE mg/dL
Ketones, ur: NEGATIVE mg/dL
Leukocytes, UA: NEGATIVE
NITRITE: NEGATIVE
Protein, ur: NEGATIVE mg/dL
Specific Gravity, Urine: 1.019 (ref 1.005–1.030)
pH: 5 (ref 5.0–8.0)

## 2018-03-30 LAB — BASIC METABOLIC PANEL
Anion gap: 11 (ref 5–15)
BUN: 14 mg/dL (ref 6–20)
CALCIUM: 8.6 mg/dL — AB (ref 8.9–10.3)
CO2: 24 mmol/L (ref 22–32)
Chloride: 108 mmol/L (ref 98–111)
Creatinine, Ser: 0.62 mg/dL (ref 0.44–1.00)
GFR calc Af Amer: >60 mL/min (ref >60)
GFR calc non Af Amer: >60 mL/min (ref >60)
GLUCOSE: 116 mg/dL — AB (ref 70–99)
Potassium: 3.4 mmol/L — ABNORMAL LOW (ref 3.5–5.1)
Sodium: 143 mmol/L (ref 135–145)

## 2018-03-30 LAB — POC URINE PREG, ED: Preg Test, Ur: NEGATIVE

## 2018-03-30 MED ORDER — OXYCODONE-ACETAMINOPHEN 5-325 MG PO TABS
1.0000 | ORAL_TABLET | Freq: Once | ORAL | Status: AC
  Administered 2018-03-30: 1 via ORAL
  Filled 2018-03-30: qty 1

## 2018-03-30 MED ORDER — HYDROCHLOROTHIAZIDE 12.5 MG PO CAPS: 26.0000 mg | ORAL_CAPSULE | Freq: Every day | ORAL | 0 refills | Status: DC

## 2018-03-30 MED ORDER — PROCHLORPERAZINE MALEATE 10 MG PO TABS: 10.0000 mg | ORAL_TABLET | Freq: Four times a day (QID) | ORAL | 0 refills | Status: DC | PRN

## 2018-03-30 MED ORDER — SODIUM CHLORIDE 0.9 % IV BOLUS
1000.0000 mL | Freq: Once | INTRAVENOUS | Status: AC
  Administered 2018-03-30: 1000 mL via INTRAVENOUS

## 2018-03-30 NOTE — ED Triage Notes (Signed)
Pt states "I think my BP is high." state dizzy x few days. States worse today. Denies taking meds for BP. C/o congestion and eyes hurting and HA. A&O, ambulatory.

## 2018-03-30 NOTE — ED Notes (Signed)
Pt alert and oriented X4, active, cooperative, pt in NAD. RR even and unlabored, color WNL.  Pt informed to return if any life threatening symptoms occur.  Discharge and followup instructions reviewed. Ambulates safely. 

## 2018-03-30 NOTE — Discharge Instructions (Signed)
Drink plenty of fluids, at this time, your CT scan is negative.  You would prefer not to have a lumbar puncture, which is certainly your choice but to some extent limits her ability to further work-up her headache here.  If you have severe headache persistent vomiting or any worsening symptoms including fever, stiff neck, seizure, or you feel worse in any way including worsening headache return to the emergency department.  Otherwise, please follow-up closely with your primary care doctor.  Blood pressure is somewhat elevated here however it is lower than it has been in the past.  We will restart the blood pressure medication used to be on but we do strongly recommend that you follow closely with primary care in the next day or 2.  Take over-the-counter medications as prescribed if you have ongoing discomfort.  The nausea medication should help your headache and your nausea.

## 2018-03-30 NOTE — ED Provider Notes (Addendum)
[facility] Emergency Department Provider Note  ____________________________________________   I have reviewed the triage vital signs and the nursing notes. Where available I have reviewed prior notes and, if possible and indicated, outside hospital notes.    HISTORY  Chief Complaint Headache    HPI Shannon Ochoa is a 37 y.o. female who is healthy, patient states that she has been having mild headaches, nasal congestion and feeling generally poor with some nausea for the last couple days.  Gradual onset.  No fever no chills.  Taking Tylenol with minimal relief, headache is a frontal/facial headache.  She denies any fever chills or stiff neck.  She denies any actual vomiting.  She states she does not normally get headaches.  She states that this is atypical for her.  It was not thunderclap however and not the worst headache of life.  I enter the room, patient is actually eating a [employer] breakfast but she states that it is difficult for her to eat because she feels some nausea.    History reviewed. No pertinent past medical history.  There are no active problems to display for this patient.   History reviewed. No pertinent surgical history.  Prior to Admission medications   Medication Sig Start Date End Date Taking? Authorizing Provider  hydrochlorothiazide (MICROZIDE) 12.5 MG capsule Take 2 capsules (25 mg total) by mouth daily. 05/25/15 05/24/16  Joni Reining, PA-C  traMADol (ULTRAM) 50 MG tablet Take 1 tablet (50 mg total) by mouth every 6 (six) hours as needed for moderate pain. 01/19/15   Joni Reining, PA-C    Allergies Patient has no known allergies.  History reviewed. No pertinent family history.  Social History Social History   Tobacco Use  . Smoking status: Current Some Day Smoker  . Smokeless tobacco: Never Used  Substance Use Topics  . Alcohol use: No  . Drug use: Not on file    Review of Systems Constitutional: No  fever/chills Eyes: No visual changes. ENT: No sore throat. No stiff neck no neck pain Cardiovascular: Denies chest pain. Respiratory: Denies shortness of breath. Gastrointestinal:   no vomiting.  No diarrhea.  No constipation. Genitourinary: Negative for dysuria. Musculoskeletal: Negative lower extremity swelling Skin: Negative for rash. Neurological: Negative for severe headaches, focal weakness or numbness.   ____________________________________________   PHYSICAL EXAM:  VITAL SIGNS: ED Triage Vitals [03/30/18 0901]  Enc Vitals Group     BP (!) 150/99     Pulse Rate 100     Resp 16     Temp 98.1 F (36.7 C)     Temp Source Oral     SpO2 97 %     Weight 180 lb (81.6 kg)     Height 5\' 2"  (1.575 m)     Head Circumference      Peak Flow      Pain Score 9     Pain Loc      Pain Edu?      Excl. in GC?     Constitutional: Alert and oriented. Well appearing and in no acute distress.  Eating [employer] and looking at her cell phone Eyes: Conjunctivae are normal Head: Atraumatic HEENT: + clear congestion/rhinnorhea. Mucous membranes are moist.  Oropharynx non-erythematous Neck:   Nontender with no meningismus, no masses, no stridor Cardiovascular: Normal rate, regular rhythm. Grossly normal heart sounds.  Good peripheral circulation. Respiratory: Normal respiratory effort.  No retractions. Lungs CTAB. Abdominal: Soft and nontender. No distention. No guarding no  rebound Back:  There is no focal tenderness or step off.  there is no midline tenderness there are no lesions noted. there is no CVA tenderness Musculoskeletal: No lower extremity tenderness, no upper extremity tenderness. No joint effusions, no DVT signs strong distal pulses no edema Neurologic: Cranial nerves II through XII are grossly intact 5 out of 5 strength bilateral upper and lower extremity. Finger to nose within normal limits heel to shin within normal limits, speech is normal with no word finding difficulty or  dysarthria, reflexes symmetric, pupils are equally round and reactive to light, there is no pronator drift, sensation is normal, vision is intact to confrontation, gait is deferred, there is no nystagmus, normal neurologic exam Skin:  Skin is warm, dry and intact. No rash noted. Psychiatric: Mood and affect are normal. Speech and behavior are normal.  ____________________________________________   LABS (all labs ordered are listed, but only abnormal results are displayed)  Labs Reviewed  BASIC METABOLIC PANEL - Abnormal; Notable for the following components:      Result Value   Potassium 3.4 (*)    Glucose, Bld 116 (*)    Calcium 8.6 (*)    All other components within normal limits  CBC WITH DIFFERENTIAL/PLATELET  URINALYSIS, COMPLETE (UACMP) WITH MICROSCOPIC  POC URINE PREG, ED    Pertinent labs  results that were available during my care of the patient were reviewed by me and considered in my medical decision making (see chart for details). ____________________________________________  EKG  I personally interpreted any EKGs ordered by me or triage  ____________________________________________  RADIOLOGY  Pertinent labs & imaging results that were available during my care of the patient were reviewed by me and considered in my medical decision making (see chart for details). If possible, patient and/or family made aware of any abnormal findings.  No results found. ____________________________________________    PROCEDURES  Procedure(s) performed: None  Procedures  Critical Care performed: None  ____________________________________________   INITIAL IMPRESSION / ASSESSMENT AND PLAN / ED COURSE  Pertinent labs & imaging results that were available during my care of the patient were reviewed by me and considered in my medical decision making (see chart for details).  Patient with very reassuring exam no meningismus, she is complaining of a very atypical headache  for her but is in the context of sinus pressure and seems to be somewhat elicitable.  Given her somewhat elevated blood pressure and what she described as an atypical I will do a CT scan although have low suspicion for bleed and even less for meningitis.  That is negative, is my hope that we get her feeling clinically better and get her home  ----------------------------------------- 11:52 AM on 03/30/2018 -----------------------------------------  Patient texting on her cell phone in no acute distress, laughing and joking with me and family, very well-appearing work-up is reassuring here.  I have explained that the only real thing I can do at this point to further evaluate her headache emergently would be to do a lumbar puncture, I discussed the risk benefits alternative of lumbar puncture as well as the risk benefits and alternatives of not doing a lumbar puncture.  Patient strongly feels that she does not want that procedure done.  She does understand that without doing that there is a limited what I can do in terms of working up her headache any further.  She states her headache is nearly gone she is eating and drinking in the room and she looks quite well.  Think  this is a hypertensive crisis, very slight headache in the context of congestion with minimally elevated blood pressure.  Patient states that routinely her blood pressure goes up when she is around doctors but she is been told by her primary care doctor that she does not need to be on medications and I am reluctant to change that emergently at this time.  Blood pressure is much less than it has been on multiple different visits here.    ____________________________________________   FINAL CLINICAL IMPRESSION(S) / ED DIAGNOSES  Final diagnoses:  None      This chart was dictated using voice recognition software.  Despite best efforts to proofread,  errors can occur which can change meaning.      Jeanmarie PlantMcShane, Nahomi Hegner A, MD 03/30/18  1004    Jeanmarie PlantMcShane, Jones Viviani A, MD 03/30/18 1005    Jeanmarie PlantMcShane, Nobel Brar A, MD 03/30/18 1153    Jeanmarie PlantMcShane, Haston Casebolt A, MD 03/30/18 1236

## 2018-03-30 NOTE — ED Notes (Signed)
Patient transported to CT 

## 2018-08-04 ENCOUNTER — Encounter: Payer: Self-pay | Admitting: Emergency Medicine

## 2018-08-04 ENCOUNTER — Other Ambulatory Visit: Payer: Self-pay

## 2018-08-04 ENCOUNTER — Emergency Department
Admission: EM | Admit: 2018-08-04 | Discharge: 2018-08-04 | Disposition: A | Discharge status: Home / Self Care | Payer: Self-pay | Attending: Emergency Medicine | Admitting: Emergency Medicine

## 2018-08-04 DIAGNOSIS — F17200 Nicotine dependence, unspecified, uncomplicated: Secondary | ICD-10-CM | POA: Insufficient documentation

## 2018-08-04 DIAGNOSIS — J069 Acute upper respiratory infection, unspecified: Secondary | ICD-10-CM | POA: Insufficient documentation

## 2018-08-04 MED ORDER — GUAIFENESIN-CODEINE 100-10 MG/5ML PO SOLN: 5.0000 mL | Freq: Four times a day (QID) | ORAL | 0 refills | Status: DC | PRN

## 2018-08-04 NOTE — ED Notes (Signed)
Delay in DC d/t registration

## 2018-08-04 NOTE — ED Triage Notes (Signed)
Patient reports dry cough and intermittent fever since Wednesday. Reports no relief with OTC meds.

## 2018-08-04 NOTE — ED Notes (Signed)
[phone] is pt new cell phone number

## 2018-08-04 NOTE — ED Provider Notes (Signed)
[facility] Emergency Department Provider Note  Time seen: 12:21 PM  I have reviewed the triage vital signs and the nursing notes.   HISTORY  Chief Complaint Cough and Fever   HPI Shannon Ochoa is a 38 y.o. female with no significant past medical history presents to the emergency department for 5 days of cough, fever, sneezing.  According to the patient for the past 5 days she has had cough, a dry tickle in her throat, sneezing, and states fever at home as well.  Patient states she works at [employer] and her boss wanted her to come get evaluated before returning to work.  Patient states she has not had a fever since Friday.  Denies any chest pain or abdominal pain.  No shortness of breath.   History reviewed. No pertinent past medical history.  There are no active problems to display for this patient.   History reviewed. No pertinent surgical history.  Prior to Admission medications   Medication Sig Start Date End Date Taking? Authorizing Provider  hydrochlorothiazide (MICROZIDE) 12.5 MG capsule Take 2 capsules (25 mg total) by mouth daily. 03/30/18 04/29/18  Jeanmarie Plant, MD  prochlorperazine (COMPAZINE) 10 MG tablet Take 1 tablet (10 mg total) by mouth every 6 (six) hours as needed for nausea or vomiting. 03/30/18   Jeanmarie Plant, MD  traMADol (ULTRAM) 50 MG tablet Take 1 tablet (50 mg total) by mouth every 6 (six) hours as needed for moderate pain. 01/19/15   Joni Reining, PA-C    No Known Allergies  No family history on file.  Social History Social History   Tobacco Use  . Smoking status: Current Some Day Smoker  . Smokeless tobacco: Never Used  Substance Use Topics  . Alcohol use: No  . Drug use: Not on file    Review of Systems Constitutional: Negative for fever. Cardiovascular: Negative for chest pain. Respiratory: Negative for shortness of breath.  Positive for cough.  Dry. Gastrointestinal: Negative for abdominal pain,  vomiting Musculoskeletal: Negative for musculoskeletal complaints Skin: Negative for skin complaints  Neurological: Negative for headache All other ROS negative  ____________________________________________   PHYSICAL EXAM:  VITAL SIGNS: ED Triage Vitals [08/04/18 1208]  Enc Vitals Group     BP (!) 125/91     Pulse Rate (!) 110     Resp 18     Temp 98.2 F (36.8 C)     Temp Source Oral     SpO2 99 %     Weight 203 lb (92.1 kg)     Height 5\' 2"  (1.575 m)     Head Circumference      Peak Flow      Pain Score 0     Pain Loc      Pain Edu?      Excl. in GC?    Constitutional: Alert and oriented. Well appearing and in no distress.  Occasional dry cough during exam. Eyes: Normal exam ENT   Head: Normocephalic and atraumatic.   Mouth/Throat: Mucous membranes are moist. Cardiovascular: Normal rate, regular rhythm. No murmur Respiratory: Normal respiratory effort without tachypnea nor retractions. Breath sounds are clear Gastrointestinal: Soft and nontender. No distention.  Musculoskeletal: Nontender with normal range of motion in all extremities.  Neurologic:  Normal speech and language. No gross focal neurologic deficits Skin:  Skin is warm, dry and intact.  Psychiatric: Mood and affect are normal.     INITIAL IMPRESSION / ASSESSMENT AND PLAN / ED  COURSE  Pertinent labs & imaging results that were available during my care of the patient were reviewed by me and considered in my medical decision making (see chart for details).  Patient presents to the emergency department with occasional dry cough, sneezing and fever at home over the past 5 days.  Differential would include upper respiratory infection, highly suspect viral illness.  Less likely coronavirus, no known high risk exposure, no persistent fever.  Overall the patient appears very well.  I did discuss remaining in isolation until she is 3 days free of fever which will be Monday.  We will also prescribe a cough  medication for the patient.  Better my normal upper respiratory return precautions.  Shannon Ochoa was evaluated in Emergency Department on 08/04/2018 for the symptoms described in the history of present illness. She was evaluated in the context of the global COVID-19 pandemic, which necessitated consideration that the patient might be at risk for infection with the SARS-CoV-2 virus that causes COVID-19. Institutional protocols and algorithms that pertain to the evaluation of patients at risk for COVID-19 are in a state of rapid change based on information released by regulatory bodies including the CDC and federal and state organizations. These policies and algorithms were followed during the patient's care in the ED.   ____________________________________________   FINAL CLINICAL IMPRESSION(S) / ED DIAGNOSES  Upper respiratory infection   Minna Antis, MD 08/04/18 1224

## 2018-09-18 ENCOUNTER — Other Ambulatory Visit: Payer: Self-pay

## 2018-09-18 ENCOUNTER — Emergency Department: Payer: Medicaid Other

## 2018-09-18 ENCOUNTER — Emergency Department
Admission: EM | Admit: 2018-09-18 | Discharge: 2018-09-18 | Disposition: A | Discharge status: Home / Self Care | Payer: Medicaid Other | Attending: Emergency Medicine | Admitting: Emergency Medicine

## 2018-09-18 DIAGNOSIS — Z3A24 24 weeks gestation of pregnancy: Secondary | ICD-10-CM | POA: Insufficient documentation

## 2018-09-18 DIAGNOSIS — N760 Acute vaginitis: Secondary | ICD-10-CM | POA: Diagnosis not present

## 2018-09-18 DIAGNOSIS — N3000 Acute cystitis without hematuria: Secondary | ICD-10-CM | POA: Insufficient documentation

## 2018-09-18 DIAGNOSIS — N939 Abnormal uterine and vaginal bleeding, unspecified: Secondary | ICD-10-CM | POA: Diagnosis present

## 2018-09-18 DIAGNOSIS — O9989 Other specified diseases and conditions complicating pregnancy, childbirth and the puerperium: Secondary | ICD-10-CM | POA: Insufficient documentation

## 2018-09-18 DIAGNOSIS — B9689 Other specified bacterial agents as the cause of diseases classified elsewhere: Secondary | ICD-10-CM

## 2018-09-18 DIAGNOSIS — F17200 Nicotine dependence, unspecified, uncomplicated: Secondary | ICD-10-CM | POA: Insufficient documentation

## 2018-09-18 DIAGNOSIS — N898 Other specified noninflammatory disorders of vagina: Secondary | ICD-10-CM

## 2018-09-18 DIAGNOSIS — O99332 Smoking (tobacco) complicating pregnancy, second trimester: Secondary | ICD-10-CM | POA: Diagnosis not present

## 2018-09-18 DIAGNOSIS — O26899 Other specified pregnancy related conditions, unspecified trimester: Secondary | ICD-10-CM

## 2018-09-18 LAB — URINALYSIS, COMPLETE (UACMP) WITH MICROSCOPIC
Bacteria, UA: NONE SEEN
Bilirubin Urine: NEGATIVE
Glucose, UA: NEGATIVE mg/dL
Ketones, ur: NEGATIVE mg/dL
Nitrite: NEGATIVE
Protein, ur: NEGATIVE mg/dL
Specific Gravity, Urine: 1.018 (ref 1.005–1.030)
pH: 6 (ref 5.0–8.0)

## 2018-09-18 LAB — CBC WITH DIFFERENTIAL/PLATELET
Abs Immature Granulocytes: 0.09 10*3/uL — ABNORMAL HIGH (ref 0.00–0.07)
Basophils Absolute: 0.1 10*3/uL (ref 0.0–0.1)
Basophils Relative: 0 %
Eosinophils Absolute: 0 10*3/uL (ref 0.0–0.5)
Eosinophils Relative: 0 %
HCT: 30 % — ABNORMAL LOW (ref 36.0–46.0)
Hemoglobin: 9.6 g/dL — ABNORMAL LOW (ref 12.0–15.0)
Immature Granulocytes: 1 %
Lymphocytes Relative: 17 %
Lymphs Abs: 1.9 10*3/uL (ref 0.7–4.0)
MCH: 29.9 pg (ref 26.0–34.0)
MCHC: 32 g/dL (ref 30.0–36.0)
MCV: 93.5 fL (ref 80.0–100.0)
Monocytes Absolute: 0.6 10*3/uL (ref 0.1–1.0)
Monocytes Relative: 6 %
Neutro Abs: 8.6 10*3/uL — ABNORMAL HIGH (ref 1.7–7.7)
Neutrophils Relative %: 76 %
Platelets: 272 10*3/uL (ref 150–400)
RBC: 3.21 MIL/uL — ABNORMAL LOW (ref 3.87–5.11)
RDW: 13.3 % (ref 11.5–15.5)
WBC: 11.3 10*3/uL — ABNORMAL HIGH (ref 4.0–10.5)
nRBC: 0 % (ref 0.0–0.2)

## 2018-09-18 LAB — CHLAMYDIA/NGC RT PCR (ARMC ONLY)??????????: Chlamydia Tr: NOT DETECTED

## 2018-09-18 LAB — BASIC METABOLIC PANEL
Anion gap: 10 (ref 5–15)
BUN: 8 mg/dL (ref 6–20)
CO2: 19 mmol/L — ABNORMAL LOW (ref 22–32)
Calcium: 8.8 mg/dL — ABNORMAL LOW (ref 8.9–10.3)
Chloride: 109 mmol/L (ref 98–111)
Creatinine, Ser: 0.52 mg/dL (ref 0.44–1.00)
GFR calc Af Amer: >60 mL/min (ref >60)
GFR calc non Af Amer: >60 mL/min (ref >60)
Glucose, Bld: 89 mg/dL (ref 70–99)
Potassium: 3.9 mmol/L (ref 3.5–5.1)
Sodium: 138 mmol/L (ref 135–145)

## 2018-09-18 LAB — WET PREP, GENITAL
Sperm: NONE SEEN
Trich, Wet Prep: NONE SEEN
Yeast Wet Prep HPF POC: NONE SEEN

## 2018-09-18 LAB — POCT PREGNANCY, URINE: Preg Test, Ur: POSITIVE — AB

## 2018-09-18 LAB — HCG, QUANTITATIVE, PREGNANCY: hCG, Beta Chain, Quant, S: 14779 m[IU]/mL — ABNORMAL HIGH (ref <5)

## 2018-09-18 LAB — CHLAMYDIA/NGC RT PCR (ARMC ONLY): N gonorrhoeae: NOT DETECTED

## 2018-09-18 MED ORDER — METRONIDAZOLE 500 MG PO TABS
500.0000 mg | ORAL_TABLET | Freq: Once | ORAL | Status: AC
  Administered 2018-09-18: 12:00:00 500 mg via ORAL
  Filled 2018-09-18: qty 1

## 2018-09-18 MED ORDER — CEPHALEXIN 500 MG PO CAPS: 500.0000 mg | ORAL_CAPSULE | Freq: Two times a day (BID) | ORAL | 0 refills | Status: AC

## 2018-09-18 MED ORDER — METRONIDAZOLE 500 MG PO TABS: 500.0000 mg | ORAL_TABLET | Freq: Two times a day (BID) | ORAL | 0 refills | Status: AC

## 2018-09-18 MED ORDER — CEPHALEXIN 500 MG PO CAPS
500.0000 mg | ORAL_CAPSULE | Freq: Once | ORAL | Status: AC
  Administered 2018-09-18: 500 mg via ORAL
  Filled 2018-09-18: qty 1

## 2018-09-18 NOTE — Discharge Instructions (Addendum)
Your labs, exam, and [place] results have been shared with you. Take the prescription antibiotics for BV & UTI as directed. Follow-up with [facility] for follow-up and [facility] for prenatal care.

## 2018-09-18 NOTE — ED Notes (Signed)
Back from U/S  Awaiting results  Denies any complaints at present

## 2018-09-18 NOTE — ED Notes (Addendum)
See triage note  States her last Depo shot was ;ast Oct  And she spotted in Feb   States she has had some heart burning and "feelings" of being pregnant  States she took 2 pregnancy test at home which were positive   Denies any vaginal discharge but feels like she has some clear fluid coming from vaginal area

## 2018-09-18 NOTE — ED Triage Notes (Signed)
Pt states she has had 2 positive pregnancy test at home and thinks she is leaking fluid, states she has been on Depo for the past 12 years and usually doesn't have a period. States she had some spotting about 3 months ago. Denies any pain.

## 2018-09-18 NOTE — ED Provider Notes (Signed)
[facility] Emergency Department Provider Note ____________________________________________  Time seen: 0811  I have reviewed the triage vital signs and the nursing notes.  HISTORY  Chief Complaint  Possible Pregnancy  HPI Shannon Ochoa is a 38 y.o. female G3P2 presents herself to the ED for evaluation of possible pregnancy. She reports she had her last Depo injection in October. In February, she report 2 days of scant spotting. She had been unable to get her repeat Depo, initially due to elevated BP on her GYN visit. She was deferred and placed on BP meds. When she return to the clinic, her BP was again elevated. She was then dismissed and her care was transferred to [facility]. She has since been unable to get care and have her shot administered. She admits to taking a home pregnancy test on last Friday, and then two on Mother's Day; all tests were positive. She presents now for confirmation, dates, and evaluation of what she describes as fluid leaking when she sneezes, coughs, or moves. She denies fevers, chills, abdominal/pelvic pain, abnormal vaginal bleeding, or chest pain.   History reviewed. No pertinent past medical history.  There are no active problems to display for this patient.  History reviewed. No pertinent surgical history.  Prior to Admission medications   Medication Sig Start Date End Date Taking? Authorizing Provider  cephALEXin (KEFLEX) 500 MG capsule Take 1 capsule (500 mg total) by mouth 2 (two) times daily for 7 days. 09/18/18 09/25/18  [name], Charlesetta Ivory, PA-C  metroNIDAZOLE (FLAGYL) 500 MG tablet Take 1 tablet (500 mg total) by mouth 2 (two) times daily for 7 days. 09/18/18 09/25/18  Rudie Rikard, Charlesetta Ivory, PA-C    Allergies Patient has no known allergies.  No family history on file.  Social History Social History   Tobacco Use  . Smoking status: Current Some Day Smoker  . Smokeless tobacco: Never Used  Substance Use Topics   . Alcohol use: No  . Drug use: Not on file    Review of Systems  Constitutional: Negative for fever. Eyes: Negative for visual changes. ENT: Negative for sore throat. Cardiovascular: Negative for chest pain. Respiratory: Negative for shortness of breath. Gastrointestinal: Negative for abdominal pain, vomiting and diarrhea. Genitourinary: Negative for dysuria. Reports vaginal discharge and + HPT Musculoskeletal: Negative for back pain. Skin: Negative for rash. Neurological: Negative for headaches, focal weakness or numbness. ____________________________________________  PHYSICAL EXAM:  VITAL SIGNS: ED Triage Vitals  Enc Vitals Group     BP 09/18/18 0804 (!) 144/92     Pulse Rate 09/18/18 0804 87     Resp 09/18/18 0804 17     Temp 09/18/18 0804 98.6 F (37 C)     Temp Source 09/18/18 0804 Oral     SpO2 09/18/18 0804 100 %     Weight 09/18/18 0801 190 lb (86.2 kg)     Height 09/18/18 0801 5\' 4"  (1.626 m)     Head Circumference --      Peak Flow --      Pain Score 09/18/18 0801 0     Pain Loc --      Pain Edu? --      Excl. in GC? --     Constitutional: Alert and oriented. Well appearing and in no distress. Head: Normocephalic and atraumatic. Eyes: Conjunctivae are normal. Normal extraocular movements Cardiovascular: Normal rate, regular rhythm. Normal distal pulses. Respiratory: Normal respiratory effort. No wheezes/rales/rhonchi. Gastrointestinal: Soft and nontender. No distention. GU: normal external  genitalia. Yellowish, frothy discharge noted in the vault. Cervix is down-pointing and os is not well visualized. No CMT or adnexal masses. Fundal height two fingers below the umbilicus.  Musculoskeletal: Nontender with normal range of motion in all extremities.  Neurologic:  Normal gait without ataxia. Normal speech and language. No gross focal neurologic deficits are appreciated. Skin:  Skin is warm, dry and intact. No rash noted. Psychiatric: Mood and affect are  normal. Patient exhibits appropriate insight and judgment. ____________________________________________   LABS (pertinent positives/negatives) Labs Reviewed  WET PREP, GENITAL - Abnormal; Notable for the following components:      Result Value   Clue Cells Wet Prep HPF POC PRESENT (*)    WBC, Wet Prep HPF POC MANY (*)    All other components within normal limits  URINALYSIS, COMPLETE (UACMP) WITH MICROSCOPIC - Abnormal; Notable for the following components:   Color, Urine YELLOW (*)    APPearance HAZY (*)    Hgb urine dipstick MODERATE (*)    Leukocytes,Ua MODERATE (*)    All other components within normal limits  BASIC METABOLIC PANEL - Abnormal; Notable for the following components:   CO2 19 (*)    Calcium 8.8 (*)    All other components within normal limits  CBC WITH DIFFERENTIAL/PLATELET - Abnormal; Notable for the following components:   WBC 11.3 (*)    RBC 3.21 (*)    Hemoglobin 9.6 (*)    HCT 30.0 (*)    Neutro Abs 8.6 (*)    Abs Immature Granulocytes 0.09 (*)    All other components within normal limits  HCG, QUANTITATIVE, PREGNANCY - Abnormal; Notable for the following components:   hCG, Beta Chain, Quant, S 14,779 (*)    All other components within normal limits  POCT PREGNANCY, URINE - Abnormal; Notable for the following components:   Preg Test, Ur POSITIVE (*)    All other components within normal limits  CHLAMYDIA/NGC RT PCR ([facility] ONLY)  POC URINE PREG, ED  ____________________________________________   RADIOLOGY  US OB  Limited  IMPRESSION: Single live intrauterine gestation with estimated gestational age of approximately 24 weeks. Placenta anterior in location without demonstrable abruption or previa. Amniotic fluid volume is felt to be within normal limits for gestational age. Fetal stomach and bladder visualized. Maternal cervical os closed. ____________________________________________  PROCEDURES  Procedures Metronidazole 500 mg  PO Keflex 500 mg PO ____________________________________________  INITIAL IMPRESSION / ASSESSMENT AND PLAN / ED COURSE  Sula Soda was evaluated in Emergency Department on 09/18/2018 for the symptoms described in the history of present illness. She was evaluated in the context of the global COVID-19 pandemic, which necessitated consideration that the patient might be at risk for infection with the SARS-CoV-2 virus that causes COVID-19. Institutional protocols and algorithms that pertain to the evaluation of patients at risk for COVID-19 are in a state of rapid change based on information released by regulatory bodies including the CDC and federal and state organizations. These policies and algorithms were followed during the patient's care in the ED.  Patient with ED evaluation of positive pregnancy test at home as well as some vaginal discharge.  She was found to have a quant at greater than 14,000 here in ED.  Her ultrasound confirms a single gestation at 24 weeks by size, without any acute abnormality.  Her labs and wet prep confirmed BV as well as a possible cystitis patient be treated empirically with Keflex for the cystitis and metronidazole for the BV.  She  is encouraged to establish prenatal care with the [facility].  She should follow with primary provider return to the ED as needed. ____________________________________________  FINAL CLINICAL IMPRESSION(S) / ED DIAGNOSES  Final diagnoses:  BV (bacterial vaginosis)  Acute cystitis without hematuria  [redacted] weeks gestation of pregnancy      Karmen StabsMenshew, Charlesetta IvoryJenise V Bacon, PA-C 09/18/18 1215    Jene EveryKinner, Robert, MD 09/18/18 1230

## 2018-09-25 LAB — HM HIV SCREENING LAB: HM HIV Screening: NEGATIVE

## 2018-09-25 LAB — HM PAP SMEAR: HM Pap smear: NEGATIVE

## 2018-09-26 ENCOUNTER — Other Ambulatory Visit: Payer: Self-pay

## 2018-09-26 ENCOUNTER — Inpatient Hospital Stay: Payer: Medicaid Other | Admitting: Registered Nurse

## 2018-09-26 ENCOUNTER — Inpatient Hospital Stay (HOSPITAL_COMMUNITY)
Admission: AD | Admit: 2018-09-26 | Discharge: 2018-10-01 | DRG: 776 | Disposition: A | Discharge status: Home / Self Care | Payer: Medicaid Other | Source: Other Acute Inpatient Hospital | Attending: Obstetrics and Gynecology | Admitting: Obstetrics and Gynecology

## 2018-09-26 ENCOUNTER — Encounter: Payer: Self-pay | Admitting: Advanced Practice Midwife

## 2018-09-26 ENCOUNTER — Encounter
Admission: EM | Disposition: A | Discharge status: Other Acute Inpatient Hospital | Payer: Self-pay | Source: Home / Self Care | Attending: Obstetrics and Gynecology

## 2018-09-26 ENCOUNTER — Inpatient Hospital Stay
Admission: EM | Admit: 2018-09-26 | Discharge: 2018-09-26 | DRG: 786 | Disposition: A | Discharge status: Other Acute Inpatient Hospital | Payer: Medicaid Other | Attending: Obstetrics and Gynecology | Admitting: Obstetrics and Gynecology

## 2018-09-26 DIAGNOSIS — O2340 Unspecified infection of urinary tract in pregnancy, unspecified trimester: Secondary | ICD-10-CM | POA: Diagnosis present

## 2018-09-26 DIAGNOSIS — O9902 Anemia complicating childbirth: Secondary | ICD-10-CM | POA: Diagnosis present

## 2018-09-26 DIAGNOSIS — B9689 Other specified bacterial agents as the cause of diseases classified elsewhere: Secondary | ICD-10-CM | POA: Diagnosis present

## 2018-09-26 DIAGNOSIS — F17210 Nicotine dependence, cigarettes, uncomplicated: Secondary | ICD-10-CM | POA: Diagnosis present

## 2018-09-26 DIAGNOSIS — O8620 Urinary tract infection following delivery, unspecified: Secondary | ICD-10-CM | POA: Diagnosis present

## 2018-09-26 DIAGNOSIS — I10 Essential (primary) hypertension: Secondary | ICD-10-CM | POA: Diagnosis present

## 2018-09-26 DIAGNOSIS — O328XX0 Maternal care for other malpresentation of fetus, not applicable or unspecified: Secondary | ICD-10-CM | POA: Diagnosis present

## 2018-09-26 DIAGNOSIS — O42912 Preterm premature rupture of membranes, unspecified as to length of time between rupture and onset of labor, second trimester: Secondary | ICD-10-CM | POA: Diagnosis not present

## 2018-09-26 DIAGNOSIS — O9081 Anemia of the puerperium: Secondary | ICD-10-CM | POA: Diagnosis present

## 2018-09-26 DIAGNOSIS — O1003 Pre-existing essential hypertension complicating the puerperium: Principal | ICD-10-CM | POA: Diagnosis present

## 2018-09-26 DIAGNOSIS — Z1159 Encounter for screening for other viral diseases: Secondary | ICD-10-CM | POA: Diagnosis not present

## 2018-09-26 DIAGNOSIS — O99824 Streptococcus B carrier state complicating childbirth: Secondary | ICD-10-CM | POA: Diagnosis present

## 2018-09-26 DIAGNOSIS — D649 Anemia, unspecified: Secondary | ICD-10-CM | POA: Diagnosis present

## 2018-09-26 DIAGNOSIS — O1002 Pre-existing essential hypertension complicating childbirth: Secondary | ICD-10-CM | POA: Diagnosis present

## 2018-09-26 DIAGNOSIS — O99334 Smoking (tobacco) complicating childbirth: Secondary | ICD-10-CM | POA: Diagnosis present

## 2018-09-26 DIAGNOSIS — F1290 Cannabis use, unspecified, uncomplicated: Secondary | ICD-10-CM | POA: Diagnosis present

## 2018-09-26 DIAGNOSIS — O42919 Preterm premature rupture of membranes, unspecified as to length of time between rupture and onset of labor, unspecified trimester: Secondary | ICD-10-CM | POA: Diagnosis present

## 2018-09-26 DIAGNOSIS — Z3A25 25 weeks gestation of pregnancy: Secondary | ICD-10-CM

## 2018-09-26 DIAGNOSIS — O0930 Supervision of pregnancy with insufficient antenatal care, unspecified trimester: Secondary | ICD-10-CM | POA: Insufficient documentation

## 2018-09-26 DIAGNOSIS — Z3482 Encounter for supervision of other normal pregnancy, second trimester: Secondary | ICD-10-CM

## 2018-09-26 DIAGNOSIS — O99324 Drug use complicating childbirth: Secondary | ICD-10-CM | POA: Diagnosis present

## 2018-09-26 DIAGNOSIS — O4290 Premature rupture of membranes, unspecified as to length of time between rupture and onset of labor, unspecified weeks of gestation: Secondary | ICD-10-CM | POA: Diagnosis present

## 2018-09-26 HISTORY — DX: Other specified health status: Z78.9

## 2018-09-26 LAB — URINE DRUG SCREEN, QUALITATIVE (ARMC ONLY)
Amphetamines, Ur Screen: NOT DETECTED
Barbiturates, Ur Screen: NOT DETECTED
Benzodiazepine, Ur Scrn: NOT DETECTED
Cannabinoid 50 Ng, Ur ~~LOC~~: POSITIVE — AB
Cocaine Metabolite,Ur ~~LOC~~: NOT DETECTED
MDMA (Ecstasy)Ur Screen: NOT DETECTED
Methadone Scn, Ur: NOT DETECTED
Opiate, Ur Screen: NOT DETECTED
Phencyclidine (PCP) Ur S: NOT DETECTED
Tricyclic, Ur Screen: NOT DETECTED

## 2018-09-26 LAB — URINALYSIS, COMPLETE (UACMP) WITH MICROSCOPIC
Bacteria, UA: NONE SEEN
Bilirubin Urine: NEGATIVE
Glucose, UA: NEGATIVE mg/dL
Ketones, ur: NEGATIVE mg/dL
Nitrite: NEGATIVE
Protein, ur: 30 mg/dL — AB
Specific Gravity, Urine: 1.023 (ref 1.005–1.030)
WBC, UA: >50 WBC/hpf — ABNORMAL HIGH (ref 0–5)
pH: 6 (ref 5.0–8.0)

## 2018-09-26 LAB — RAPID HIV SCREEN (HIV 1/2 AB+AG)
HIV 1/2 Antibodies: NONREACTIVE
HIV-1 P24 Antigen - HIV24: NONREACTIVE

## 2018-09-26 LAB — RUPTURE OF MEMBRANE (ROM)PLUS: Rom Plus: POSITIVE

## 2018-09-26 LAB — CBC
HCT: 28.9 % — ABNORMAL LOW (ref 36.0–46.0)
HCT: 26.7 % — ABNORMAL LOW (ref 36.0–46.0)
Hemoglobin: 9.2 g/dL — ABNORMAL LOW (ref 12.0–15.0)
Hemoglobin: 9 g/dL — ABNORMAL LOW (ref 12.0–15.0)
MCH: 29 pg (ref 26.0–34.0)
MCH: 30.1 pg (ref 26.0–34.0)
MCHC: 31.8 g/dL (ref 30.0–36.0)
MCHC: 33.7 g/dL (ref 30.0–36.0)
MCV: 91.2 fL (ref 80.0–100.0)
MCV: 89.3 fL (ref 80.0–100.0)
Platelets: 324 10*3/uL (ref 150–400)
Platelets: 321 10*3/uL (ref 150–400)
RBC: 3.17 MIL/uL — ABNORMAL LOW (ref 3.87–5.11)
RBC: 2.99 MIL/uL — ABNORMAL LOW (ref 3.87–5.11)
RDW: 13.2 % (ref 11.5–15.5)
RDW: 13.2 % (ref 11.5–15.5)
WBC: 19.6 10*3/uL — ABNORMAL HIGH (ref 4.0–10.5)
WBC: 27.9 10*3/uL — ABNORMAL HIGH (ref 4.0–10.5)
nRBC: 0 % (ref 0.0–0.2)
nRBC: 0 % (ref 0.0–0.2)

## 2018-09-26 LAB — HEPATITIS B SURFACE ANTIGEN: Hepatitis B Surface Ag: NEGATIVE

## 2018-09-26 LAB — CREATININE, SERUM
Creatinine, Ser: 0.59 mg/dL (ref 0.44–1.00)
GFR calc Af Amer: >60 mL/min (ref >60)
GFR calc non Af Amer: >60 mL/min (ref >60)

## 2018-09-26 LAB — TYPE AND SCREEN
ABO/RH(D): A POS
Antibody Screen: NEGATIVE

## 2018-09-26 LAB — WET PREP, GENITAL
Clue Cells Wet Prep HPF POC: NONE SEEN
Sperm: NONE SEEN
Trich, Wet Prep: NONE SEEN
Yeast Wet Prep HPF POC: NONE SEEN

## 2018-09-26 LAB — CHLAMYDIA/NGC RT PCR (ARMC ONLY)
Chlamydia Tr: NOT DETECTED
N gonorrhoeae: NOT DETECTED

## 2018-09-26 LAB — GROUP B STREP BY PCR: Group B strep by PCR: POSITIVE — AB

## 2018-09-26 LAB — SARS CORONAVIRUS 2 BY RT PCR (HOSPITAL ORDER, PERFORMED IN ~~LOC~~ HOSPITAL LAB): SARS Coronavirus 2: NEGATIVE

## 2018-09-26 LAB — FETAL FIBRONECTIN: Fetal Fibronectin: POSITIVE — AB

## 2018-09-26 SURGERY — Surgical Case
Anesthesia: Spinal | Site: Abdomen

## 2018-09-26 MED ORDER — NALOXONE HCL 0.4 MG/ML IJ SOLN: 0.4000 mg | INTRAMUSCULAR | Status: DC | PRN

## 2018-09-26 MED ORDER — COCONUT OIL OIL: 1.0000 "application " | TOPICAL_OIL | Status: DC | PRN

## 2018-09-26 MED ORDER — LACTATED RINGERS IV SOLN
INTRAVENOUS | Status: DC
  Administered 2018-09-26: 21:00:00 via INTRAVENOUS

## 2018-09-26 MED ORDER — OXYTOCIN 40 UNITS IN NORMAL SALINE INFUSION - SIMPLE MED: 2.5000 [IU]/h | INTRAVENOUS | Status: AC

## 2018-09-26 MED ORDER — DIPHENHYDRAMINE HCL 25 MG PO CAPS: 25.0000 mg | ORAL_CAPSULE | ORAL | Status: DC | PRN

## 2018-09-26 MED ORDER — ACETAMINOPHEN 500 MG PO TABS
1000.0000 mg | ORAL_TABLET | Freq: Four times a day (QID) | ORAL | Status: DC
  Administered 2018-09-26: 18:00:00 1000 mg via ORAL
  Filled 2018-09-26: qty 2

## 2018-09-26 MED ORDER — FENTANYL CITRATE (PF) 100 MCG/2ML IJ SOLN
INTRAMUSCULAR | Status: AC
  Filled 2018-09-26: qty 2

## 2018-09-26 MED ORDER — ZOLPIDEM TARTRATE 5 MG PO TABS: 5.0000 mg | ORAL_TABLET | Freq: Every evening | ORAL | Status: DC | PRN

## 2018-09-26 MED ORDER — BUPIVACAINE HCL (PF) 0.5 % IJ SOLN
INTRAMUSCULAR | Status: AC
  Filled 2018-09-26: qty 30

## 2018-09-26 MED ORDER — METRONIDAZOLE 500 MG PO TABS
500.0000 mg | ORAL_TABLET | Freq: Two times a day (BID) | ORAL | Status: DC
  Administered 2018-09-26 – 2018-10-01 (×10): 500 mg via ORAL
  Filled 2018-09-26 (×10): qty 1

## 2018-09-26 MED ORDER — SULFAMETHOXAZOLE-TRIMETHOPRIM 800-160 MG PO TABS
1.0000 | ORAL_TABLET | Freq: Two times a day (BID) | ORAL | Status: DC
  Administered 2018-09-26: 10:00:00 1 via ORAL
  Filled 2018-09-26: qty 1

## 2018-09-26 MED ORDER — BUPIVACAINE LIPOSOME 1.3 % IJ SUSP
INTRAMUSCULAR | Status: DC | PRN
  Administered 2018-09-26: 14:00:00 50 mL

## 2018-09-26 MED ORDER — TERBUTALINE SULFATE 1 MG/ML IJ SOLN: 0.2500 mg | INTRAMUSCULAR | Status: DC | PRN

## 2018-09-26 MED ORDER — ONDANSETRON HCL 4 MG/2ML IJ SOLN: 4.0000 mg | Freq: Three times a day (TID) | INTRAMUSCULAR | Status: DC | PRN

## 2018-09-26 MED ORDER — MAGNESIUM SULFATE BOLUS VIA INFUSION
4.0000 g | Freq: Once | INTRAVENOUS | Status: AC
  Administered 2018-09-26: 11:00:00 4 g via INTRAVENOUS
  Filled 2018-09-26: qty 500

## 2018-09-26 MED ORDER — OXYCODONE-ACETAMINOPHEN 5-325 MG PO TABS: 2.0000 | ORAL_TABLET | ORAL | Status: DC | PRN

## 2018-09-26 MED ORDER — BUPIVACAINE LIPOSOME 1.3 % IJ SUSP
INTRAMUSCULAR | Status: AC
  Filled 2018-09-26: qty 20

## 2018-09-26 MED ORDER — AZITHROMYCIN 500 MG PO TABS
1000.0000 mg | ORAL_TABLET | Freq: Once | ORAL | Status: AC
  Administered 2018-09-26: 10:00:00 1000 mg via ORAL
  Filled 2018-09-26: qty 2

## 2018-09-26 MED ORDER — DOCUSATE SODIUM 100 MG PO CAPS: 100.0000 mg | ORAL_CAPSULE | Freq: Every day | ORAL | Status: DC

## 2018-09-26 MED ORDER — ACETAMINOPHEN 325 MG PO TABS: 650.0000 mg | ORAL_TABLET | ORAL | Status: DC | PRN

## 2018-09-26 MED ORDER — PRENATAL MULTIVITAMIN CH
1.0000 | ORAL_TABLET | Freq: Every day | ORAL | Status: DC
  Administered 2018-09-27 – 2018-10-01 (×5): 1 via ORAL
  Filled 2018-09-26 (×6): qty 1

## 2018-09-26 MED ORDER — MEDROXYPROGESTERONE ACETATE 150 MG/ML IM SUSP
150.0000 mg | Freq: Once | INTRAMUSCULAR | Status: AC
  Administered 2018-09-27: 150 mg via INTRAMUSCULAR
  Filled 2018-09-26: qty 1

## 2018-09-26 MED ORDER — FENTANYL CITRATE (PF) 100 MCG/2ML IJ SOLN: 25.0000 ug | INTRAMUSCULAR | Status: DC | PRN

## 2018-09-26 MED ORDER — OXYTOCIN BOLUS FROM INFUSION: 500.0000 mL | Freq: Once | INTRAVENOUS | Status: DC

## 2018-09-26 MED ORDER — SODIUM CHLORIDE (PF) 0.9 % IJ SOLN
INTRAMUSCULAR | Status: DC | PRN
  Administered 2018-09-26: 50 mL via INTRAVENOUS

## 2018-09-26 MED ORDER — SIMETHICONE 80 MG PO CHEW
80.0000 mg | CHEWABLE_TABLET | ORAL | Status: DC | PRN
  Administered 2018-10-01: 80 mg via ORAL
  Filled 2018-09-26: qty 1

## 2018-09-26 MED ORDER — ACETAMINOPHEN 325 MG PO TABS
650.0000 mg | ORAL_TABLET | ORAL | Status: DC | PRN
  Administered 2018-09-27 – 2018-10-01 (×17): 650 mg via ORAL
  Filled 2018-09-26 (×17): qty 2

## 2018-09-26 MED ORDER — TETANUS-DIPHTH-ACELL PERTUSSIS 5-2.5-18.5 LF-MCG/0.5 IM SUSP
0.5000 mL | Freq: Once | INTRAMUSCULAR | Status: AC
  Administered 2018-09-30: 0.5 mL via INTRAMUSCULAR
  Filled 2018-09-26: qty 0.5

## 2018-09-26 MED ORDER — BUPIVACAINE IN DEXTROSE 0.75-8.25 % IT SOLN
INTRATHECAL | Status: DC | PRN
  Administered 2018-09-26: 1.6 mL via INTRATHECAL

## 2018-09-26 MED ORDER — AMOXICILLIN 500 MG PO CAPS
500.0000 mg | ORAL_CAPSULE | Freq: Three times a day (TID) | ORAL | Status: DC
  Filled 2018-09-26: qty 1

## 2018-09-26 MED ORDER — TERBUTALINE SULFATE 1 MG/ML IJ SOLN
0.2500 mg | Freq: Once | INTRAMUSCULAR | Status: AC
  Administered 2018-09-26: 09:00:00 0.25 mg via SUBCUTANEOUS

## 2018-09-26 MED ORDER — DEXAMETHASONE SODIUM PHOSPHATE 10 MG/ML IJ SOLN
INTRAMUSCULAR | Status: DC | PRN
  Administered 2018-09-26: 10 mg via INTRAVENOUS

## 2018-09-26 MED ORDER — DEXAMETHASONE SODIUM PHOSPHATE 10 MG/ML IJ SOLN
INTRAMUSCULAR | Status: AC
  Filled 2018-09-26: qty 1

## 2018-09-26 MED ORDER — PROPOFOL 10 MG/ML IV BOLUS
INTRAVENOUS | Status: AC
  Filled 2018-09-26: qty 20

## 2018-09-26 MED ORDER — SODIUM CHLORIDE 0.9 % IV SOLN
2.0000 g | Freq: Four times a day (QID) | INTRAVENOUS | Status: DC
  Administered 2018-09-26: 12:00:00 2 g via INTRAVENOUS
  Filled 2018-09-26: qty 2000

## 2018-09-26 MED ORDER — FENTANYL CITRATE (PF) 100 MCG/2ML IJ SOLN
INTRAMUSCULAR | Status: DC | PRN
  Administered 2018-09-26: 10 ug via INTRATHECAL

## 2018-09-26 MED ORDER — NALBUPHINE HCL 10 MG/ML IJ SOLN: 5.0000 mg | INTRAMUSCULAR | Status: DC | PRN

## 2018-09-26 MED ORDER — LACTATED RINGERS IV SOLN
INTRAVENOUS | Status: DC
  Administered 2018-09-26 (×2): via INTRAVENOUS

## 2018-09-26 MED ORDER — KETOROLAC TROMETHAMINE 30 MG/ML IJ SOLN
30.0000 mg | Freq: Four times a day (QID) | INTRAMUSCULAR | Status: DC | PRN
  Administered 2018-09-26: 30 mg via INTRAVENOUS

## 2018-09-26 MED ORDER — ONDANSETRON HCL 4 MG/2ML IJ SOLN: 4.0000 mg | Freq: Once | INTRAMUSCULAR | Status: DC | PRN

## 2018-09-26 MED ORDER — SODIUM CHLORIDE (PF) 0.9 % IJ SOLN
INTRAMUSCULAR | Status: AC
  Filled 2018-09-26: qty 50

## 2018-09-26 MED ORDER — SODIUM CHLORIDE 0.9 % IV SOLN
INTRAVENOUS | Status: DC | PRN
  Administered 2018-09-26: 12:00:00 50 ug/min via INTRAVENOUS

## 2018-09-26 MED ORDER — OXYTOCIN 40 UNITS IN NORMAL SALINE INFUSION - SIMPLE MED
2.5000 [IU]/h | INTRAVENOUS | Status: DC
  Administered 2018-09-26: 13:00:00 1000 mL via INTRAVENOUS

## 2018-09-26 MED ORDER — ONDANSETRON HCL 4 MG/2ML IJ SOLN
INTRAMUSCULAR | Status: AC
  Filled 2018-09-26: qty 2

## 2018-09-26 MED ORDER — SODIUM CHLORIDE 0.9% FLUSH: 3.0000 mL | INTRAVENOUS | Status: DC | PRN

## 2018-09-26 MED ORDER — MEPERIDINE HCL 25 MG/ML IJ SOLN: 6.2500 mg | INTRAMUSCULAR | Status: DC | PRN

## 2018-09-26 MED ORDER — BETAMETHASONE SOD PHOS & ACET 6 (3-3) MG/ML IJ SUSP
12.0000 mg | INTRAMUSCULAR | Status: DC
  Administered 2018-09-26: 12 mg via INTRAMUSCULAR

## 2018-09-26 MED ORDER — MORPHINE SULFATE (PF) 0.5 MG/ML IJ SOLN
INTRAMUSCULAR | Status: AC
  Filled 2018-09-26: qty 10

## 2018-09-26 MED ORDER — BETAMETHASONE SOD PHOS & ACET 6 (3-3) MG/ML IJ SUSP
INTRAMUSCULAR | Status: AC
  Filled 2018-09-26: qty 5

## 2018-09-26 MED ORDER — MORPHINE SULFATE (PF) 0.5 MG/ML IJ SOLN
INTRAMUSCULAR | Status: DC | PRN
  Administered 2018-09-26: .1 mg via INTRATHECAL

## 2018-09-26 MED ORDER — KETOROLAC TROMETHAMINE 30 MG/ML IJ SOLN
30.0000 mg | Freq: Four times a day (QID) | INTRAMUSCULAR | Status: DC | PRN
  Filled 2018-09-26: qty 1

## 2018-09-26 MED ORDER — DIBUCAINE (PERIANAL) 1 % EX OINT: 1.0000 "application " | TOPICAL_OINTMENT | CUTANEOUS | Status: DC | PRN

## 2018-09-26 MED ORDER — DIPHENHYDRAMINE HCL 25 MG PO CAPS: 25.0000 mg | ORAL_CAPSULE | Freq: Four times a day (QID) | ORAL | Status: DC | PRN

## 2018-09-26 MED ORDER — CEFAZOLIN SODIUM-DEXTROSE 2-4 GM/100ML-% IV SOLN
2.0000 g | INTRAVENOUS | Status: AC
  Administered 2018-09-26: 13:00:00 2 g via INTRAVENOUS

## 2018-09-26 MED ORDER — CEFAZOLIN SODIUM 1 G IJ SOLR
INTRAMUSCULAR | Status: AC
  Filled 2018-09-26: qty 20

## 2018-09-26 MED ORDER — ONDANSETRON HCL 4 MG/2ML IJ SOLN: 4.0000 mg | Freq: Four times a day (QID) | INTRAMUSCULAR | Status: DC | PRN

## 2018-09-26 MED ORDER — SIMETHICONE 80 MG PO CHEW
80.0000 mg | CHEWABLE_TABLET | Freq: Three times a day (TID) | ORAL | Status: DC
  Administered 2018-09-27 – 2018-10-01 (×13): 80 mg via ORAL
  Filled 2018-09-26 (×14): qty 1

## 2018-09-26 MED ORDER — OXYCODONE HCL 5 MG PO TABS
5.0000 mg | ORAL_TABLET | ORAL | Status: DC | PRN
  Administered 2018-09-27 (×4): 10 mg via ORAL
  Administered 2018-09-27: 5 mg via ORAL
  Administered 2018-09-28 (×3): 10 mg via ORAL
  Administered 2018-09-28: 5 mg via ORAL
  Administered 2018-09-29 – 2018-10-01 (×7): 10 mg via ORAL
  Filled 2018-09-26 (×11): qty 2
  Filled 2018-09-26: qty 1
  Filled 2018-09-26: qty 2
  Filled 2018-09-26: qty 1
  Filled 2018-09-26 (×2): qty 2

## 2018-09-26 MED ORDER — SIMETHICONE 80 MG PO CHEW
80.0000 mg | CHEWABLE_TABLET | ORAL | Status: DC
  Administered 2018-09-26 – 2018-09-30 (×5): 80 mg via ORAL
  Filled 2018-09-26 (×5): qty 1

## 2018-09-26 MED ORDER — CALCIUM GLUCONATE 10 % IV SOLN
INTRAVENOUS | Status: AC
  Filled 2018-09-26: qty 10

## 2018-09-26 MED ORDER — SUCCINYLCHOLINE CHLORIDE 20 MG/ML IJ SOLN
INTRAMUSCULAR | Status: AC
  Filled 2018-09-26: qty 1

## 2018-09-26 MED ORDER — MENTHOL 3 MG MT LOZG: 1.0000 | LOZENGE | OROMUCOSAL | Status: DC | PRN

## 2018-09-26 MED ORDER — OXYCODONE-ACETAMINOPHEN 5-325 MG PO TABS: 1.0000 | ORAL_TABLET | ORAL | Status: DC | PRN

## 2018-09-26 MED ORDER — HYDROMORPHONE HCL 2 MG PO TABS: 1.0000 mg | ORAL_TABLET | ORAL | Status: DC | PRN

## 2018-09-26 MED ORDER — ENOXAPARIN SODIUM 40 MG/0.4ML ~~LOC~~ SOLN
40.0000 mg | SUBCUTANEOUS | Status: DC
  Administered 2018-09-27 – 2018-10-01 (×5): 40 mg via SUBCUTANEOUS
  Filled 2018-09-26 (×5): qty 0.4

## 2018-09-26 MED ORDER — TERBUTALINE SULFATE 1 MG/ML IJ SOLN
INTRAMUSCULAR | Status: AC
  Filled 2018-09-26: qty 1

## 2018-09-26 MED ORDER — SOD CITRATE-CITRIC ACID 500-334 MG/5ML PO SOLN
30.0000 mL | ORAL | Status: DC | PRN
  Administered 2018-09-26: 12:00:00 30 mL via ORAL
  Filled 2018-09-26: qty 30

## 2018-09-26 MED ORDER — CALCIUM CARBONATE ANTACID 500 MG PO CHEW: 2.0000 | CHEWABLE_TABLET | ORAL | Status: DC | PRN

## 2018-09-26 MED ORDER — CEPHALEXIN 500 MG PO CAPS
500.0000 mg | ORAL_CAPSULE | Freq: Two times a day (BID) | ORAL | Status: DC
  Administered 2018-09-26 – 2018-10-01 (×10): 500 mg via ORAL
  Filled 2018-09-26 (×10): qty 1

## 2018-09-26 MED ORDER — ONDANSETRON HCL 4 MG/2ML IJ SOLN
INTRAMUSCULAR | Status: DC | PRN
  Administered 2018-09-26 (×2): 4 mg via INTRAVENOUS

## 2018-09-26 MED ORDER — WITCH HAZEL-GLYCERIN EX PADS: 1.0000 "application " | MEDICATED_PAD | CUTANEOUS | Status: DC | PRN

## 2018-09-26 MED ORDER — DIPHENHYDRAMINE HCL 50 MG/ML IJ SOLN: 12.5000 mg | INTRAMUSCULAR | Status: DC | PRN

## 2018-09-26 MED ORDER — INDOMETHACIN 25 MG PO CAPS
25.0000 mg | ORAL_CAPSULE | Freq: Four times a day (QID) | ORAL | Status: DC
  Filled 2018-09-26: qty 1

## 2018-09-26 MED ORDER — INDOMETHACIN 50 MG PO CAPS
50.0000 mg | ORAL_CAPSULE | Freq: Once | ORAL | Status: AC
  Administered 2018-09-26: 10:00:00 50 mg via ORAL
  Filled 2018-09-26: qty 1

## 2018-09-26 MED ORDER — SENNOSIDES-DOCUSATE SODIUM 8.6-50 MG PO TABS
2.0000 | ORAL_TABLET | ORAL | Status: DC
  Administered 2018-09-26 – 2018-09-30 (×5): 2 via ORAL
  Filled 2018-09-26 (×5): qty 2

## 2018-09-26 MED ORDER — MAGNESIUM SULFATE 40 G IN LACTATED RINGERS - SIMPLE
2.0000 g/h | INTRAVENOUS | Status: DC
  Filled 2018-09-26: qty 500

## 2018-09-26 SURGICAL SUPPLY — 29 items
BARRIER ADHS 3X4 INTERCEED (GAUZE/BANDAGES/DRESSINGS) ×2 IMPLANT
CANISTER SUCT 3000ML PPV (MISCELLANEOUS) ×2 IMPLANT
CHLORAPREP W/TINT 26 (MISCELLANEOUS) ×2 IMPLANT
COVER WAND RF STERILE (DRAPES) ×2 IMPLANT
DRSG OPSITE POSTOP 4X10 (GAUZE/BANDAGES/DRESSINGS) ×2 IMPLANT
DRSG TELFA 3X8 NADH (GAUZE/BANDAGES/DRESSINGS) ×2 IMPLANT
ELECT REM PT RETURN 9FT ADLT (ELECTROSURGICAL) ×2
ELECTRODE REM PT RTRN 9FT ADLT (ELECTROSURGICAL) ×1 IMPLANT
GAUZE SPONGE 4X4 12PLY STRL (GAUZE/BANDAGES/DRESSINGS) ×2 IMPLANT
GOWN STRL REUS W/ TWL LRG LVL3 (GOWN DISPOSABLE) ×3 IMPLANT
GOWN STRL REUS W/TWL LRG LVL3 (GOWN DISPOSABLE) ×3
HEMOSTAT ARISTA ABSORB 1G (HEMOSTASIS) ×2 IMPLANT
NEEDLE HYPO 25GX1X1/2 BEV (NEEDLE) ×2 IMPLANT
NS IRRIG 1000ML POUR BTL (IV SOLUTION) ×2 IMPLANT
PACK C SECTION AR (MISCELLANEOUS) ×2 IMPLANT
PAD OB MATERNITY 4.3X12.25 (PERSONAL CARE ITEMS) ×2 IMPLANT
PAD PREP 24X41 OB/GYN DISP (PERSONAL CARE ITEMS) ×2 IMPLANT
PENCIL SMOKE ULTRAEVAC 22 CON (MISCELLANEOUS) ×2 IMPLANT
RTRCTR C-SECT PINK 25CM LRG (MISCELLANEOUS) ×2 IMPLANT
SPONGE LAP 18X18 RF (DISPOSABLE) ×2 IMPLANT
SUT MNCRL 4-0 (SUTURE) ×1
SUT MNCRL 4-0 27XMFL (SUTURE) ×1
SUT VIC AB 0 CT1 36 (SUTURE) ×12 IMPLANT
SUT VIC AB 0 CTX 36 (SUTURE) ×2
SUT VIC AB 0 CTX36XBRD ANBCTRL (SUTURE) ×2 IMPLANT
SUT VIC AB 2-0 SH 27 (SUTURE) ×2
SUT VIC AB 2-0 SH 27XBRD (SUTURE) ×2 IMPLANT
SUTURE MNCRL 4-0 27XMF (SUTURE) ×1 IMPLANT
SYR 30ML LL (SYRINGE) ×4 IMPLANT

## 2018-09-26 NOTE — OB Triage Note (Signed)
Pt reports leaking fluid "all night", reports having to change her clothes 6 since 2000 on 09/25/18, reports the fluid as "watery and this, with mucous and brown tinge", last intercourse 2 weeks ago, being treated for UTI, started medication 5/13. Pt reports cramping and pressure, no history of PTL. No itching or burning, reports treatment for chlamydia and trichomonas 1-2 years ago.

## 2018-09-26 NOTE — Progress Notes (Signed)
 Shannon Ochoa is a 37 y.o. female. She is at [redacted]w[redacted]d gestation. No LMP recorded. Patient is pregnant. Estimated Date of Delivery: 01/09/19  Prenatal care site: [facility]  Chief complaint: leaking fluid Location: vagina Onset/timing: yesterday evening around 2000  Duration: intermittent Quality: fluid/discharge Severity: moderate Aggravating or alleviating conditions: none Associated signs/symptoms: some brown discharge, intermittent cramping Context: Spring reports leaking of fluid and discharge that started yesterday evening and has been consistent all night. The fluid has been clear, sometimes discharge like, with mucous and some brown tinged discharge. She also reports cramping that started this morning and happens twice an hour, lasting for 5 minutes or so. She was seen in the ED on 09/18/2018, at which point she was prescribed antibiotics for bacterial vaginosis and a UTI. She misread the instructions and was only taking her antibiotics once a day, instead of twice a day. She was seen for her first prenatal appointment at [facility] yesterday, at which point her provider there advised her to start taking the antibiotics twice a day.   S: Resting comfortably.   She reports:  -active fetal movement -no vaginal bleeding  Maternal Medical History:   Past Medical History:  Diagnosis Date  . Medical history non-contributory     Past Surgical History:  Procedure Laterality Date  . NO PAST SURGERIES      No Known Allergies  Prior to Admission medications   Medication Sig Start Date End Date Taking? Authorizing Provider  cephALEXin (KEFLEX) 500 MG capsule Take 500 mg by mouth 2 (two) times daily.   Yes [provider]  ferrous sulfate 325 (65 FE) MG tablet Take 325 mg by mouth daily with breakfast.   Yes [provider]  metroNIDAZOLE (FLAGYL) 500 MG tablet Take 500 mg by mouth 2 (two) times daily.   Yes [provider]  Prenatal Vit-Fe Fumarate-FA (PRENATAL  MULTIVITAMIN) TABS tablet Take 1 tablet by mouth daily at 12 noon.   Yes [provider]     Social History: She  reports that she has been smoking. She has been smoking about 0.25 packs per day. She has never used smokeless tobacco. She reports current drug use. Drug: Marijuana. She reports that she does not drink alcohol.  Family History: family history includes Diabetes in her mother and sister; Hypertension in her father, mother, and sister.  Review of Systems: A full review of systems was performed and negative except as noted in the HPI.     O:  BP (!) 147/87   Pulse 90   Temp 98 F (36.7 C) (Oral)   Resp 18   Ht 5' 2" (1.575 m)   Wt 95.3 kg   SpO2 100%   BMI 38.41 kg/m  Results for orders placed or performed during the hospital encounter of 09/26/18 (from the past 48 hour(s))  Wet prep, genital   Collection Time: 09/26/18  8:14 AM  Result Value Ref Range   Yeast Wet Prep HPF POC NONE SEEN NONE SEEN   Trich, Wet Prep NONE SEEN NONE SEEN   Clue Cells Wet Prep HPF POC NONE SEEN NONE SEEN   WBC, Wet Prep HPF POC MANY (A) NONE SEEN   Sperm NONE SEEN   Fetal fibronectin   Collection Time: 09/26/18  8:14 AM  Result Value Ref Range   Fetal Fibronectin POSITIVE (A) NEGATIVE   Appearance, FETFIB CLEAR CLEAR  Urinalysis, Complete w Microscopic   Collection Time: 09/26/18  8:14 AM  Result Value Ref Range     Color, Urine YELLOW (A) YELLOW   APPearance HAZY (A) CLEAR   Specific Gravity, Urine 1.023 1.005 - 1.030   pH 6.0 5.0 - 8.0   Glucose, UA NEGATIVE NEGATIVE mg/dL   Hgb urine dipstick MODERATE (A) NEGATIVE   Bilirubin Urine NEGATIVE NEGATIVE   Ketones, ur NEGATIVE NEGATIVE mg/dL   Protein, ur 30 (A) NEGATIVE mg/dL   Nitrite NEGATIVE NEGATIVE   Leukocytes,Ua LARGE (A) NEGATIVE   RBC / HPF 21-50 0 - 5 RBC/hpf   WBC, UA >50 (H) 0 - 5 WBC/hpf   Bacteria, UA NONE SEEN NONE SEEN   Squamous Epithelial / LPF 0-5 0 - 5   WBC Clumps PRESENT    Mucus PRESENT   ROM  Plus ([facility] only)   Collection Time: 09/26/18  8:14 AM  Result Value Ref Range   Rom Plus POSITIVE   Chlamydia/NGC rt PCR ([facility] only)   Collection Time: 09/26/18  9:51 AM  Result Value Ref Range   Specimen source GC/Chlam ENDOCERVICAL    Chlamydia Tr NOT DETECTED NOT DETECTED   N gonorrhoeae NOT DETECTED NOT DETECTED  Group B strep by PCR   Collection Time: 09/26/18  9:51 AM  Result Value Ref Range   Group B strep by PCR POSITIVE (A) NEGATIVE  CBC   Collection Time: 09/26/18  9:53 AM  Result Value Ref Range   WBC 19.6 (H) 4.0 - 10.5 K/uL   RBC 3.17 (L) 3.87 - 5.11 MIL/uL   Hemoglobin 9.2 (L) 12.0 - 15.0 g/dL   HCT 16.128.9 (L) 09.636.0 - 04.546.0 %   MCV 91.2 80.0 - 100.0 fL   MCH 29.0 26.0 - 34.0 pg   MCHC 31.8 30.0 - 36.0 g/dL   RDW 40.913.2 81.111.5 - 91.415.5 %   Platelets 324 150 - 400 K/uL   nRBC 0.0 0.0 - 0.2 %  Rapid HIV screen [facility] L&D dept ONLY)   Collection Time: 09/26/18  9:53 AM  Result Value Ref Range   HIV-1 P24 Antigen - HIV24 NON REACTIVE NON REACTIVE   HIV 1/2 Antibodies NON REACTIVE NON REACTIVE   Interpretation (HIV Ag Ab)      A non reactive test result means that HIV 1 or HIV 2 antibodies and HIV 1 p24 antigen were not detected in the specimen.  Type and screen [facility]   Collection Time: 09/26/18  9:53 AM  Result Value Ref Range   ABO/RH(D) A POS    Antibody Screen NEG    Sample Expiration      09/29/2018,2359 Performed at [facility], [address] [place], [place] [address]   SARS Coronavirus 2 (CEPHEID - Performed in [facility]), [facility]   Collection Time: 09/26/18 10:30 AM  Result Value Ref Range   SARS Coronavirus 2 NEGATIVE NEGATIVE     Constitutional: NAD, AAOx3  HE/ENT: extraocular movements grossly intact, moist mucous membranes CV: RRR PULM: normal respiratory effort, CTABL     Abd: gravid, non-tender, non-distended, soft      Ext: Non-tender, Nonedmeatous   Psych: mood appropriate, speech  normal Pelvic SSE: +pooling, cervix 1-2cm dilated on visual exam, perineum damp; repeat exam with fetal foot protruding out of cervix and into vagina  NST:  Baseline: 160 Variability: moderate Accelerations: 10x10 x >2 Decelerations: variable   A/P: 38 y.o. [id number] here for antenatal surveillance during pregnancy.  Principle diagnosis: PPROM, preterm labor, breech presentation  PPROM  ROM Plus positive, FFN positive, +pooling on speculum exam, perineum  damp  Afebrile, AVSS  Latency antibiotics started  S/p 1g azithromycin PO  Ampicillin 2g IV q6h x 48 hours ordered, typically followed to amoxicillin 500mg q8h for additional 120 hours   Contractions/cramping  Improved after dose of 0.25mg terbutaline [place], second dose given  Indomethacin ordered with 50mg PO initial dose, followed by 25mg PO q6h x 48 hours  Fetal Wellbeing  Reassuring for GA  S/p one dose of betamethasone at 0924, plan for second dose after 24 hours  Magnesium sulfate 4g load followed by 2g/hour ordered  Possible UTI  Patient treated for UTI in the ED on 09/18/2018, though was taking antibiotics daily instead of BID as directed as she misread the label. UA today with large leukocytes, many WBCs, and few to no squamous epithelial cells. Urine culture added. Bactrim-DS BID x 3 days ordered for treatment.   As I was rechecking patient's cervix in preparation for transfer to [facility], fetal foot was seen protruding out of patient's cervix and into the vagina. Dr. Beasley called, who called stat cesarean delivery. H&P note to follow.    Siaosi Alter 09/26/2018 12:23 PM  ----- Keilen Kahl, CNM Certified Nurse Midwife [facility], Department of OB/GYN [facility]    

## 2018-09-26 NOTE — H&P (Addendum)
OB History & Physical   History of Present Illness:  Chief Complaint: leakage of fluid  HPI:  Shannon Ochoa is a 38 y.o. G68P2002 female at [redacted]w[redacted]d dated by [redacted]w[redacted]d limited ultrasound. She presents to L&D for leakage of fluid and cramping.   Shannon Ochoa reports leaking of fluid and discharge that started yesterday morning. Yesterday evening, she had a large amount of leakage of fluid when she got up from sitting down, and she continued to have leakage overnight. The fluid has been clear, sometimes discharge like, with mucous and some brown tinged discharge. She also reports cramping that started this morning and happens twice an hour, lasting for 5 minutes or so. She was seen in the ED on 09/18/2018, at which point she was prescribed antibiotics for bacterial vaginosis and a UTI. She misread the instructions and was only taking her antibiotics once a day, instead of twice a day. She was seen for her first prenatal appointment at [facility] yesterday, at which point her provider there advised her to start taking the antibiotics twice a day.   She reports she had her last injection of Depo in October. In February, she report 2 days of scant spotting. She had been unable to get her repeat Depo, initially due to elevated BP at her GYN visit. She was deferred and placed on BP meds. When she return to the clinic, her BP was again elevated. She has since been unable to get care and have her shot administered. She found out she was pregnant about two weeks ago, when she took a pregnancy test at home. Limited ultrasound in the ED last week estimated gestational age at [redacted]w[redacted]d at that time.   She reports:  -active fetal movement -no vaginal bleeding  Pregnancy Issues: 1. Late to care at 25 weeks 2. Current smoker, about 1/4 PPD 3. Possible chronic hypertension 4. Advanced maternal age 1. GBS positive 6. Anemia, started iron supplement yesterday   Maternal Medical History:   Past Medical History:  Diagnosis Date   . Medical history non-contributory     Past Surgical History:  Procedure Laterality Date  . NO PAST SURGERIES      No Known Allergies  Prior to Admission medications   Medication Sig Start Date End Date Taking? Authorizing Provider  cephALEXin (KEFLEX) 500 MG capsule Take 500 mg by mouth 2 (two) times daily.   Yes [provider]  ferrous sulfate 325 (65 FE) MG tablet Take 325 mg by mouth daily with breakfast.   Yes [provider]  metroNIDAZOLE (FLAGYL) 500 MG tablet Take 500 mg by mouth 2 (two) times daily.   Yes [provider]  Prenatal Vit-Fe Fumarate-FA (PRENATAL MULTIVITAMIN) TABS tablet Take 1 tablet by mouth daily at 12 noon.   Yes [provider]     Pren[facility] care site: [facility]   Social History: She  reports that she has been smoking. She has been smoking about 0.25 packs per day. She has never used smokeless tobacco. She reports current drug use. Drug: Marijuana. She reports that she does not drink alcohol.  Family History: family history includes Diabetes in her mother and sister; Hypertension in her father, mother, and sister.   Review of Systems: A full review of systems was performed and negative except as noted in the HPI.    Physical Exam:  Vital Signs: BP 124/88   Pulse 82   Temp (!) 97 F (36.1 C) (Oral)   Resp 14   Ht  5\' 2"  (1.575 m)   Wt 95.3 kg   SpO2 100%   BMI 38.41 kg/m   General:   alert, cooperative, appears stated age and mild distress  Skin:  normal and no rash or abnormalities  Neurologic:    Alert & oriented x 3  Lungs:   clear to auscultation bilaterally  Heart:   regular rate and rhythm, S1, S2 normal, no murmur, click, rub or gallop  Abdomen:  soft, non-tender; bowel sounds normal; no masses,  no organomegaly  Pelvis:  External genitalia: normal general appearance Vaginal: normal mucosa without prolapse or lesions Cervix: fetal foot present protruding from cervix  FHT:  140 BPM   Presentations: breech  Extremities: : non-tender, symmetric, no edema bilaterally.     Results for orders placed or performed during the hospital encounter of 09/26/18 (from the past 24 hour(s))  Fetal fibronectin     Status: Abnormal   Collection Time: 09/26/18  8:14 AM  Result Value Ref Range   Fetal Fibronectin POSITIVE (A) NEGATIVE   Appearance, FETFIB CLEAR CLEAR  Wet prep, genital     Status: Abnormal   Collection Time: 09/26/18  8:14 AM  Result Value Ref Range   Yeast Wet Prep HPF POC NONE SEEN NONE SEEN   Trich, Wet Prep NONE SEEN NONE SEEN   Clue Cells Wet Prep HPF POC NONE SEEN NONE SEEN   WBC, Wet Prep HPF POC MANY (A) NONE SEEN   Sperm NONE SEEN   Urinalysis, Complete w Microscopic     Status: Abnormal   Collection Time: 09/26/18  8:14 AM  Result Value Ref Range   Color, Urine YELLOW (A) YELLOW   APPearance HAZY (A) CLEAR   Specific Gravity, Urine 1.023 1.005 - 1.030   pH 6.0 5.0 - 8.0   Glucose, UA NEGATIVE NEGATIVE mg/dL   Hgb urine dipstick MODERATE (A) NEGATIVE   Bilirubin Urine NEGATIVE NEGATIVE   Ketones, ur NEGATIVE NEGATIVE mg/dL   Protein, ur 30 (A) NEGATIVE mg/dL   Nitrite NEGATIVE NEGATIVE   Leukocytes,Ua LARGE (A) NEGATIVE   RBC / HPF 21-50 0 - 5 RBC/hpf   WBC, UA >50 (H) 0 - 5 WBC/hpf   Bacteria, UA NONE SEEN NONE SEEN   Squamous Epithelial / LPF 0-5 0 - 5   WBC Clumps PRESENT    Mucus PRESENT   ROM Plus ([facility] only)     Status: None   Collection Time: 09/26/18  8:14 AM  Result Value Ref Range   Rom Plus POSITIVE   Chlamydia/NGC rt PCR ([facility] only)     Status: None   Collection Time: 09/26/18  9:51 AM  Result Value Ref Range   Specimen source GC/Chlam ENDOCERVICAL    Chlamydia Tr NOT DETECTED NOT DETECTED   N gonorrhoeae NOT DETECTED NOT DETECTED  Group B strep by PCR     Status: Abnormal   Collection Time: 09/26/18  9:51 AM  Result Value Ref Range   Group B strep by PCR POSITIVE (A) NEGATIVE  CBC     Status: Abnormal   Collection Time:  09/26/18  9:53 AM  Result Value Ref Range   WBC 19.6 (H) 4.0 - 10.5 K/uL   RBC 3.17 (L) 3.87 - 5.11 MIL/uL   Hemoglobin 9.2 (L) 12.0 - 15.0 g/dL   HCT 62.928.9 (L) 52.836.0 - 41.346.0 %   MCV 91.2 80.0 - 100.0 fL   MCH 29.0 26.0 - 34.0 pg   MCHC 31.8 30.0 - 36.0 g/dL  RDW 13.2 11.5 - 15.5 %   Platelets 324 150 - 400 K/uL   nRBC 0.0 0.0 - 0.2 %  Rapid HIV screen [facility] L&D dept ONLY)     Status: None   Collection Time: 09/26/18  9:53 AM  Result Value Ref Range   HIV-1 P24 Antigen - HIV24 NON REACTIVE NON REACTIVE   HIV 1/2 Antibodies NON REACTIVE NON REACTIVE   Interpretation (HIV Ag Ab)      A non reactive test result means that HIV 1 or HIV 2 antibodies and HIV 1 p24 antigen were not detected in the specimen.  Hepatitis B surface antigen     Status: None   Collection Time: 09/26/18  9:53 AM  Result Value Ref Range   Hepatitis B Surface Ag Negative Negative  Type and screen [facility]     Status: None   Collection Time: 09/26/18  9:53 AM  Result Value Ref Range   ABO/RH(D) A POS    Antibody Screen NEG    Sample Expiration      09/29/2018,2359 Performed at [facility], [address] [place], [place] [address]   SARS Coronavirus 2 (CEPHEID - Performed in [facility] hospital lab), [facility] Order     Status: None   Collection Time: 09/26/18 10:30 AM  Result Value Ref Range   SARS Coronavirus 2 NEGATIVE NEGATIVE    Pertinent Results:  Prenatal Labs: Blood type/Rh A+  Antibody screen neg  Rubella pending  Varicella pending  RPR pending  HBsAg Neg  HIV NR  GC neg  Chlamydia neg  1 hour GTT Not performed  3 hour GTT N/a  GBS positive   US Ob Limited  Result Date: 09/18/2018 CLINICAL DATA:  Vaginal discharge EXAM: LIMITED OBSTETRIC ULTRASOUND FINDINGS: Number of Fetuses: 1 Heart Rate:  144 bpm Movement: Present Presentation: Cephalic Placental Location: Anterior period no demonstrable placental abruption. Previa: None Amniotic Fluid (Subjective):  Within normal limits for gestational age. AFI: 17.7 cm BPD: 5.8 cm 23 w  6 d Fetal stomach and bladder are visualized. MATERNAL FINDINGS: Cervix:  Appears closed. Uterus/Adnexae: No abnormality visualized. IMPRESSION: Single live intrauterine gestation with estimated gestational age of approximately 24 weeks. Placenta anterior in location without demonstrable abruption or previa. Amniotic fluid volume is felt to be within normal limits for gestational age. Fetal stomach and bladder visualized. Maternal cervical os closed. Note that a cause for vaginal discharge has not been established with this study. This exam is performed as a limited study on an emergent basis to address a specific clinical concern and does not comprehensively evaluate fetal size, dating, or anatomy; follow-up complete OB US should be considered if further fetal assessment is warranted. Electronically Signed   By: Bretta Bang III M.D.   On: 09/18/2018 11:47    Assessment:  Shannon Ochoa is a 38 y.o. G66P2002 female at [redacted]w[redacted]d with PPROM, preterm labor, and footling breech presentation.   Plan:  1. Admit to Labor & Delivery; consents reviewed and obtained.  - BMZ x2 - Magnesium for neuroprophalaxis -Latency abx -short course tocolysis for stabilization  -Ultrasound for fetal presentation -continuous tocometry and fetal monitoring -clear diet -OOB to bathroom only -GBS and Covid collected  2. Fetal Well being  - Fetal Tracing: reassuring for gestational age - GBS positive - Presentation: footling breech confirmed by fetal foot in vagina on speculum exam   3. Routine OB: - Prenatal labs reviewed, as above - Rh positvie - CBC & T&S  on admit - NPO, IVF  Leukocytosis: afebrile. 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 Initial plan for standard management of preterm premature rupture of membranes with transport  arranged. However, as her contractions increased despite starting tocolytics, and her baby's feet were noted in the vagina, the decision was made to move to urgent cesarean section.   The risks of cesarean section discussed with the patient included but were not limited to: bleeding which may require transfusion or reoperation; infection which may require antibiotics; injury to bowel, bladder, ureters or other surrounding organs; injury to the fetus; need for additional procedures including hysterectomy in the event of a life-threatening hemorrhage; placental abnormalities wth subsequent pregnancies, incisional problems, thromboembolic phenomenon and other postoperative/anesthesia complications.   We also discussed risks of classical cesarean section and likelihood of needing that incision.  We discussed future fertility plans in brief, and she noted that she is not interested in any further pregnancies but is not interested in permanent sterilization at this time. She will return to Depo if able. (Her last two Depo shots were deferred because of new-onset HTN, which is a Category 2 method according to the [facility]   The patient concurred with the proposed plan, giving informed written consent for the procedure. Anesthesia and OR aware. Preoperative prophylactic antibiotics and SCDs ordered on call to the OR.  To OR when ready.

## 2018-09-26 NOTE — Progress Notes (Signed)
Pt taken ro SCN to see infant prior to infant transport, pt given opportunity to touch infant and take photos.

## 2018-09-26 NOTE — Anesthesia Preprocedure Evaluation (Addendum)
Anesthesia Evaluation  Patient identified by MRN, date of birth, ID band Patient awake    Reviewed: Allergy & Precautions, H&P , NPO status , Patient's Chart, lab work & pertinent test results, reviewed documented beta blocker date and time   Airway Mallampati: II   Neck ROM: full    Dental  (+) Poor Dentition   Pulmonary neg pulmonary ROS, Current Smoker,    Pulmonary exam normal        Cardiovascular Exercise Tolerance: Good negative cardio ROS Normal cardiovascular exam Rhythm:regular Rate:Normal     Neuro/Psych negative neurological ROS  negative psych ROS   GI/Hepatic negative GI ROS, Neg liver ROS,   Endo/Other  negative endocrine ROS  Renal/GU negative Renal ROS  negative genitourinary   Musculoskeletal   Abdominal   Peds  Hematology negative hematology ROS (+)   Anesthesia Other Findings Past Medical History: No date: Medical history non-contributory Past Surgical History: No date: NO PAST SURGERIES BMI    Body Mass Index:  38.41 kg/m     Reproductive/Obstetrics negative OB ROS                             Anesthesia Physical Anesthesia Plan  ASA: II and emergent  Anesthesia Plan: Spinal   Post-op Pain Management:    Induction:   PONV Risk Score and Plan:   Airway Management Planned:   Additional Equipment:   Intra-op Plan:   Post-operative Plan:   Informed Consent: I have reviewed the patients History and Physical, chart, labs and discussed the procedure including the risks, benefits and alternatives for the proposed anesthesia with the patient or authorized representative who has indicated his/her understanding and acceptance.     Dental Advisory Given  Plan Discussed with: CRNA  Anesthesia Plan Comments: (1214 discussion with Dr. Dalbert Garnet and [facility] ok with proceeding with SAB for above with downgrade to urgent status.  Situation explained to patient and she  prefers that to GA.  Will proceed.  Last FHR in the 150s.  VSS. Pt afebrile with elevated wbc on abx.   JA)       Anesthesia Quick Evaluation

## 2018-09-26 NOTE — Progress Notes (Deleted)
Shannon Ochoa is a 38 y.o. female. She is at [redacted]w[redacted]d gestation. No LMP recorded. Patient is pregnant. Estimated Date of Delivery: 01/09/19  Prenatal care site: [facility]  Chief complaint: leaking fluid Location: vagina Onset/timing: yesterday evening around 2000  Duration: intermittent Quality: fluid/discharge Severity: moderate Aggravating or alleviating conditions: none Associated signs/symptoms: some brown discharge, intermittent cramping Context: Ludie reports leaking of fluid and discharge that started yesterday evening and has been consistent all night. The fluid has been clear, sometimes discharge like, with mucous and some brown tinged discharge. She also reports cramping that started this morning and happens twice an hour, lasting for 5 minutes or so. She was seen in the ED on 09/18/2018, at which point she was prescribed antibiotics for bacterial vaginosis and a UTI. She misread the instructions and was only taking her antibiotics once a day, instead of twice a day. She was seen for her first prenatal appointment at [facility] yesterday, at which point her provider there advised her to start taking the antibiotics twice a day.   S: Resting comfortably.   She reports:  -active fetal movement -no vaginal bleeding  Maternal Medical History:   Past Medical History:  Diagnosis Date  . Medical history non-contributory     Past Surgical History:  Procedure Laterality Date  . NO PAST SURGERIES      No Known Allergies  Prior to Admission medications   Medication Sig Start Date End Date Taking? Authorizing Provider  cephALEXin (KEFLEX) 500 MG capsule Take 500 mg by mouth 2 (two) times daily.   Yes [provider]  ferrous sulfate 325 (65 FE) MG tablet Take 325 mg by mouth daily with breakfast.   Yes [provider]  metroNIDAZOLE (FLAGYL) 500 MG tablet Take 500 mg by mouth 2 (two) times daily.   Yes [provider]  Prenatal Vit-Fe Fumarate-FA (PRENATAL  MULTIVITAMIN) TABS tablet Take 1 tablet by mouth daily at 12 noon.   Yes [provider]     Social History: She  reports that she has been smoking. She has been smoking about 0.25 packs per day. She has never used smokeless tobacco. She reports current drug use. Drug: Marijuana. She reports that she does not drink alcohol.  Family History: family history includes Diabetes in her mother and sister; Hypertension in her father, mother, and sister.  Review of Systems: A full review of systems was performed and negative except as noted in the HPI.     O:  BP (!) 147/87   Pulse 90   Temp 98 F (36.7 C) (Oral)   Resp 18   Ht  (1.575 m)   Wt 95.3 kg   SpO2 100%   BMI 38.41 kg/m  Results for orders placed or performed during the hospital encounter of 09/26/18 (from the past 48 hour(s))  Wet prep, genital   Collection Time: 09/26/18  8:14 AM  Result Value Ref Range   Yeast Wet Prep HPF POC NONE SEEN NONE SEEN   Trich, Wet Prep NONE SEEN NONE SEEN   Clue Cells Wet Prep HPF POC NONE SEEN NONE SEEN   WBC, Wet Prep HPF POC MANY (A) NONE SEEN   Sperm NONE SEEN   Fetal fibronectin   Collection Time: 09/26/18  8:14 AM  Result Value Ref Range   Fetal Fibronectin POSITIVE (A) NEGATIVE   Appearance, FETFIB CLEAR CLEAR  Urinalysis, Complete w Microscopic   Collection Time: 09/26/18  8:14 AM  Result Value Ref Range  Color, Urine YELLOW (A) YELLOW   APPearance HAZY (A) CLEAR   Specific Gravity, Urine 1.023 1.005 - 1.030   pH 6.0 5.0 - 8.0   Glucose, UA NEGATIVE NEGATIVE mg/dL   Hgb urine dipstick MODERATE (A) NEGATIVE   Bilirubin Urine NEGATIVE NEGATIVE   Ketones, ur NEGATIVE NEGATIVE mg/dL   Protein, ur 30 (A) NEGATIVE mg/dL   Nitrite NEGATIVE NEGATIVE   Leukocytes,Ua LARGE (A) NEGATIVE   RBC / HPF 21-50 0 - 5 RBC/hpf   WBC, UA >50 (H) 0 - 5 WBC/hpf   Bacteria, UA NONE SEEN NONE SEEN   Squamous Epithelial / LPF 0-5 0 - 5   WBC Clumps PRESENT    Mucus PRESENT   ROM  Plus ([facility] only)   Collection Time: 09/26/18  8:14 AM  Result Value Ref Range   Rom Plus POSITIVE   Chlamydia/NGC rt PCR ([facility] only)   Collection Time: 09/26/18  9:51 AM  Result Value Ref Range   Specimen source GC/Chlam ENDOCERVICAL    Chlamydia Tr NOT DETECTED NOT DETECTED   N gonorrhoeae NOT DETECTED NOT DETECTED  Group B strep by PCR   Collection Time: 09/26/18  9:51 AM  Result Value Ref Range   Group B strep by PCR POSITIVE (A) NEGATIVE  CBC   Collection Time: 09/26/18  9:53 AM  Result Value Ref Range   WBC 19.6 (H) 4.0 - 10.5 K/uL   RBC 3.17 (L) 3.87 - 5.11 MIL/uL   Hemoglobin 9.2 (L) 12.0 - 15.0 g/dL   HCT 16.128.9 (L) 09.636.0 - 04.546.0 %   MCV 91.2 80.0 - 100.0 fL   MCH 29.0 26.0 - 34.0 pg   MCHC 31.8 30.0 - 36.0 g/dL   RDW 40.913.2 81.111.5 - 91.415.5 %   Platelets 324 150 - 400 K/uL   nRBC 0.0 0.0 - 0.2 %  Rapid HIV screen [facility] L&D dept ONLY)   Collection Time: 09/26/18  9:53 AM  Result Value Ref Range   HIV-1 P24 Antigen - HIV24 NON REACTIVE NON REACTIVE   HIV 1/2 Antibodies NON REACTIVE NON REACTIVE   Interpretation (HIV Ag Ab)      A non reactive test result means that HIV 1 or HIV 2 antibodies and HIV 1 p24 antigen were not detected in the specimen.  Type and screen [facility]   Collection Time: 09/26/18  9:53 AM  Result Value Ref Range   ABO/RH(D) A POS    Antibody Screen NEG    Sample Expiration      09/29/2018,2359 Performed at [facility], [address] [place], [place] [address]   SARS Coronavirus 2 (CEPHEID - Performed in [facility] hospital lab), [facility] Order   Collection Time: 09/26/18 10:30 AM  Result Value Ref Range   SARS Coronavirus 2 NEGATIVE NEGATIVE     Constitutional: NAD, AAOx3  HE/ENT: extraocular movements grossly intact, moist mucous membranes CV: RRR PULM: normal respiratory effort, CTABL     Abd: gravid, non-tender, non-distended, soft      Ext: Non-tender, Nonedmeatous   Psych: mood appropriate, speech  normal Pelvic SSE: +pooling, cervix 1-2cm dilated on visual exam, perineum damp; repeat exam with fetal foot protruding out of cervix and into vagina  NST:  Baseline: 160 Variability: moderate Accelerations: 10x10 x >2 Decelerations: variable   A/P: 38 y.o. 62105w0d here for antenatal surveillance during pregnancy.  Principle diagnosis: PPROM, preterm labor, breech presentation  PPROM  ROM Plus positive, FFN positive, +pooling on speculum exam, perineum  damp  Afebrile, AVSS  Latency antibiotics started  S/p 1g azithromycin PO  Ampicillin 2g IV q6h x 48 hours ordered, typically followed to amoxicillin 500mg  q8h for additional 120 hours   Contractions/cramping  Improved after dose of 0.25mg  terbutaline [place], second dose given  Indomethacin ordered with 50mg  PO initial dose, followed by 25mg  PO q6h x 48 hours  Fetal Wellbeing  Reassuring for GA  S/p one dose of betamethasone at 0924, plan for second dose after 24 hours  Magnesium sulfate 4g load followed by 2g/hour ordered  Possible UTI  Patient treated for UTI in the ED on 09/18/2018, though was taking antibiotics daily instead of BID as directed as she misread the label. UA today with large leukocytes, many WBCs, and few to no squamous epithelial cells. Urine culture added. Bactrim-DS BID x 3 days ordered for treatment.   As I was rechecking patient's cervix in preparation for transfer to [facility], fetal foot was seen protruding out of patient's cervix and into the vagina. Dr. Dalbert Garnet called, who called stat cesarean delivery. H&P note to follow.    Genia Del 09/26/2018 12:23 PM  ----- Genia Del, CNM Certified Nurse Midwife [facility], Department of OB/GYN [facility]

## 2018-09-26 NOTE — Progress Notes (Signed)
Dr. Dalbert Garnet updated on patient condition and plan of care. Dr. Dalbert Garnet in agreement with stabilizing patient and transferring to [facility] in [place], who has already accepted the patient. Discussed administration of additional dose(s) of terbutaline in the setting of patient reporting cramping/again, as patient has already received PO indomethacin. As terbutaline and indomethacin have different mechanisms of action, Dr. Dalbert Garnet okay with giving additional dose(s) of terbutaline. 0.25mg  terbutaline [place] q48m PRN up to 3 doses ordered. Discussed with RN Zerita Boers.   Genia Del, [place] 09/26/2018 11:39 AM

## 2018-09-26 NOTE — H&P (Signed)
Obstetric History and Physical  Shannon Ochoa is a 38 y.o. [id number] POD#0 s/p classical c-section at [redacted]w[redacted]d who has been transferred to [facility] after c-section done at [facility] for PPROM with preterm labor and fetal feet in the vagina. She is transferred here per her request after transfer of infant to NICU here.   She is feeling well, has no complaints. Reports occasional soreness in lower abdomen. Small amount vaginal bleeding. She is walking without light-headedness/dizziness. She has foley catheter in place, has not passed flatus yet. Tolerating regular diet without nausea or vomiting. Minimal pain. Overall, is feeling very well and eager to visit infant in NICU.  Reviewed prenatal course, medical history. She is currently on keflex for UTI and flagyl for presumed BV, will continue these here. She reports possible history of chronic hypertension but has never been on medication. Patient is a very good historian and brought her medications with her to show [place].  Prenatal Course Source of Care: [facility] with onset of care at 25 weeks Pregnancy complications or risks: Patient Active Problem List   Diagnosis Date Noted  . Leakage of amniotic fluid 09/26/2018  . Preterm premature rupture of membranes (PPROM) delivered, current hospitalization 09/26/2018  . Limited prenatal care 09/26/2018  . Footling breech presentation 09/26/2018  . Preterm labor 09/26/2018   She plans to breast feed/pump She desires Depo-Provera for postpartum contraception.   Prenatal labs and studies: ABO, Rh: --/--/A POS (05/21 2094) Antibody: NEG (05/21 0953) Rubella:   RPR:    HBsAg: Negative (05/21 0953)  HIV: NON REACTIVE (05/21 0953)  GBS: positive 2 hr GTT:  Not done Genetic screening not done Anatomy US not done  Medical History:  Past Medical History:  Diagnosis Date  . Medical history non-contributory     Past Surgical History:  Procedure Laterality Date  . CESAREAN SECTION N/A 09/26/2018    Procedure: CESAREAN SECTION;  Surgeon: Christeen Douglas, MD;  Location: [facility] ORS;  Service: Obstetrics;  Laterality: N/A;  Female @ 1249  . NO PAST SURGERIES      OB History  Gravida Para Term Preterm AB Living  3 3 2 1   3   SAB TAB Ectopic Multiple Live Births        0 3    # Outcome Date GA Lbr Len/2nd Weight Sex Delivery Anes PTL Lv  3 Preterm 09/26/18 [redacted]w[redacted]d  710 g F  Spinal  LIV  2 Term 05/31/06   3204 g M Vag-Spont   LIV  1 Term 10/15/01   3005 g F Vag-Spont  N LIV    Social History   Socioeconomic History  . Marital status: Single    Spouse name: Not on file  . Number of children: Not on file  . Years of education: Not on file  . Highest education level: Not on file  Occupational History  . Not on file  Social Needs  . Financial resource strain: Not on file  . Food insecurity:    Worry: Not on file    Inability: Not on file  . Transportation needs:    Medical: Not on file    Non-medical: Not on file  Tobacco Use  . Smoking status: Current Some Day Smoker    Packs/day: 0.25  . Smokeless tobacco: Never Used  Substance and Sexual Activity  . Alcohol use: No  . Drug use: Yes    Types: Marijuana  . Sexual activity: Yes  Lifestyle  . Physical activity:  Days per week: Not on file    Minutes per session: Not on file  . Stress: Not on file  Relationships  . Social connections:    Talks on phone: Not on file    Gets together: Not on file    Attends religious service: Not on file    Active member of club or organization: Not on file    Attends meetings of clubs or organizations: Not on file    Relationship status: Not on file  Other Topics Concern  . Not on file  Social History Narrative  . Not on file    Family History  Problem Relation Age of Onset  . Diabetes Mother   . Hypertension Mother   . Hypertension Father   . Hypertension Sister   . Diabetes Sister     Medications Prior to Admission  Medication Sig Dispense Refill Last Dose  .  cephALEXin (KEFLEX) 500 MG capsule Take 500 mg by mouth 2 (two) times daily.     . ferrous sulfate 325 (65 FE) MG tablet Take 325 mg by mouth daily with breakfast.     . metroNIDAZOLE (FLAGYL) 500 MG tablet Take 500 mg by mouth 2 (two) times daily.     . Prenatal Vit-Fe Fumarate-FA (PRENATAL MULTIVITAMIN) TABS tablet Take 1 tablet by mouth daily at 12 noon.       No Known Allergies  Review of Systems: Negative except for what is mentioned in HPI.  Physical Exam: BP 139/82 (BP Location: Left Arm)   Pulse 86   Temp 98.4 F (36.9 C) (Oral)   Resp 17   SpO2 99%  CONSTITUTIONAL: Well-developed, well-nourished female in no acute distress.  HENT:  Normocephalic, atraumatic, External right and left ear normal. Oropharynx is clear and moist EYES: Conjunctivae and EOM are normal. Pupils are equal, round, and reactive to light. No scleral icterus.  NECK: Normal range of motion, supple, no masses SKIN: Skin is warm and dry. No rash noted. Not diaphoretic. No erythema. No pallor. NEUROLOGIC: Alert and oriented to person, place, and time. Normal reflexes, muscle tone coordination. No cranial nerve deficit noted. PSYCHIATRIC: Normal mood and affect. Normal behavior. Normal judgment and thought content. CARDIOVASCULAR: Normal heart rate noted, regular rhythm RESPIRATORY: Effort normal, no problems with respiration noted ABDOMEN: Soft, non-distended, Incision with bandage covering it, no active bleeding noted. Abdomen appropriately tender to palpation, fundus below umbilicus. MUSCULOSKELETAL: Normal range of motion. No edema and no tenderness    Pertinent Labs/Studies:   Results for orders placed or performed during the hospital encounter of 09/26/18 (from the past 24 hour(s))  CBC     Status: Abnormal   Collection Time: 09/26/18  8:53 PM  Result Value Ref Range   WBC 27.9 (H) 4.0 - 10.5 K/uL   RBC 2.99 (L) 3.87 - 5.11 MIL/uL   Hemoglobin 9.0 (L) 12.0 - 15.0 g/dL   HCT 16.1 (L) 09.6 - 04.5 %    MCV 89.3 80.0 - 100.0 fL   MCH 30.1 26.0 - 34.0 pg   MCHC 33.7 30.0 - 36.0 g/dL   RDW 40.9 81.1 - 91.4 %   Platelets 321 150 - 400 K/uL   nRBC 0.0 0.0 - 0.2 %  Creatinine, serum     Status: None   Collection Time: 09/26/18  8:53 PM  Result Value Ref Range   Creatinine, Ser 0.59 0.44 - 1.00 mg/dL   GFR calc non Af Amer >60 >60 mL/min   GFR calc Af Amer >60 >  60 mL/min    Assessment : Shannon Ochoa is a 38 y.o. [id number] POD#0 s/p classical c-section at 6466w0d for preterm labor and PPROM with fetal malpresentation. Limited prenatal care. [facility] patient [facility]. She is doing very well from an operative standpoint. Reviewed expected course, discharge planning, medications, NICU. Answered all questions. Depo for contraception.  - Routine post op care - monitor BP, advised will start anti-hypertensive as needed - lovenox - regular diet - pain meds prn - keflex - flagyl - SW consult - f/u pending prenatal labs - depo   Baldemar LenisK. Meryl Alphus Zeck, M.D. Attending Center [facility]Faculty Practice)  09/26/2018, 11:04 PM

## 2018-09-26 NOTE — Anesthesia Post-op Follow-up Note (Signed)
Anesthesia QCDR form completed.        

## 2018-09-26 NOTE — Transfer of Care (Signed)
Immediate Anesthesia Transfer of Care Note  Patient: Shannon Ochoa  Procedure(s) Performed: CESAREAN SECTION (N/A Abdomen)  Patient Location: Mother/Baby  Anesthesia Type:Spinal  Level of Consciousness: awake, alert  and oriented  Airway & Oxygen Therapy: Patient Spontanous Breathing  Post-op Assessment: Report given to RN and Post -op Vital signs reviewed and stable  Post vital signs: Reviewed  Last Vitals:  Vitals Value Taken Time  BP 122/78 09/26/2018  2:19 PM  Temp 36.1 C 09/26/2018  2:19 PM  Pulse 84 09/26/2018  2:19 PM  Resp 16 09/26/2018  2:19 PM  SpO2 99 % 09/26/2018  2:19 PM    Last Pain:  Vitals:   09/26/18 1132  TempSrc: Oral  PainSc:          Complications: No apparent anesthesia complications

## 2018-09-26 NOTE — Discharge Summary (Signed)
Obstetrical Discharge Summary  Patient Name: Shannon SodaShaketha L Persons DOB: [birth year] MRN: [id number]  Date of Admission: 09/26/2018 Date of Discharge: 09/26/2018  Primary OB: [facility]  Gestational Age at Delivery: 10236w0d   Antepartum complications:  Pregnancy Issues: 1. Late to care at 25 weeks 2. Current smoker, about 1/4 PPD 3. Possible chronic hypertension 4. Advanced maternal age 38. GBS positive 6. Anemia, started iron supplement yesterday  Admitting Diagnosis: Preterm premature rupture of membrans Secondary Diagnosis: Preterm labor, Footling Breech presentation Patient Active Problem List   Diagnosis Date Noted  . Leakage of amniotic fluid 09/26/2018  . Preterm premature rupture of membranes (PPROM) delivered, current hospitalization 09/26/2018  . Limited prenatal care 09/26/2018  . Footling breech presentation 09/26/2018   Intrapartum complications/course:  37yo G3P2002 at 25+0wks by BPD at 23wks6days in an ER scan last week with an unknown LMP and a Depo shot given in 02/2018. On the scan last week, her infant was in cephalic presentation. She was leaking clear fluid in the ER that was determined to be urine from UTI.  She had her first OB visit with [facility] yesterday, and was leaking a little then. Yesterday evening she began leaking more clear fluid that started earlier in the day. She had been treated from the ER for BV and UTI, not fully completed by now and she had been taking keflex once daily instead of more frequently, though no dysuria now but some frequency. She denies prior cervical surgery, recent abdominal trauma, fundal tenderness, drug use except for THC and 1/4 pack cigarettes daily, or prior preterm birth.  She presented with continued copious clear fluid, which was positive for ROM+ and ffn positive. A speculum exam noted 2-3cm dilation. Fetal heart rate was reassuring and appropriate for gestational age. She was afebrile without fundal tenderness, but WBC of 19.6, up  from 11.3 8 days ago. She was given terbutaline and po indomethicin loading dose, her first dose of BMZ and magnesium for neuroprophalaxis. Her latency abx were started and a GBS returned positive. Her Covid test was negative.  In the middle of arranging transport to a higher level NICU, her contractions became painful. A repeat speculum exam noted fetal feet in the vagina and the decision was made to proceed with urgent cesarean delivery.  Date of Delivery: 09/26/2018 Delivered By: Christeen DouglasBethany Beasley Delivery Type: primary cesarean section, classical incision Anesthesia: spinal Placenta: Spontaneous  Newborn Data: Live born female  Birth Weight:  710g APGAR: 2, 8  Newborn Delivery   Birth date/time:  09/26/2018 12:49:00 Delivery type:  Cesarean section     Discharge Physical Exam:  BP 119/75 (BP Location: Left Arm)   Pulse 81   Temp 97.8 F (36.6 C) (Axillary)   Resp 16   Ht 5\' 2"  (1.575 m)   Wt 95.3 kg   SpO2 100%   Breastfeeding Unknown   BMI 38.41 kg/m   General: NAD CV: RRR Pulm: CTABL, nl effort ABD: s/nd/nt, fundus firm and below the umbilicus Lochia: moderate Incision: c/d/i  DVT Evaluation: LE non-ttp, no evidence of DVT on exam.  Hemoglobin  Date Value Ref Range Status  09/26/2018 9.2 (L) 12.0 - 15.0 g/dL Final   HGB  Date Value Ref Range Status  11/25/2012 11.7 (L) 12.0 - 16.0 g/dL Final   HCT  Date Value Ref Range Status  09/26/2018 28.9 (L) 36.0 - 46.0 % Final  11/25/2012 35.2 35.0 - 47.0 % Final    Post partum course: [facility] [place] POD#0 to recover in proximity to her daughter Disposition: stable, discharge to home.  Baby Disposition: to NICU at [facility]  Contraception: Desires Depo-Provera  Prenatal Labs: Blood type/Rh --/--/A POS (05/21 8335)  Antibody screen neg  Rubella pending  Varicella pending  RPR pending  HBsAg Neg  HIV NR  GC neg  Chlamydia neg  1 hour GTT none  3 hour GTT  n/a  GBS Positive  COVID-19 negative   Plan:  Shannon Ochoa was transferred to [facility] in good condition. Follow-up appointment at [facility] with delivering provider in 2 weeks.   Discharge Medications: Allergies as of 09/26/2018   No Known Allergies     Follow-up Information    Christeen Douglas, MD. Schedule an appointment as soon as possible for a visit in 2 week(s).   Specialty:  Obstetrics and Gynecology Why:  For 2 week follow up after surgery Contact information: [address] [place] [address] [phone]           Signed: Genia Del, CNM 09/26/2018 6:20 PM

## 2018-09-26 NOTE — Anesthesia Procedure Notes (Addendum)
Spinal  Patient location during procedure: OR Start time: 09/26/2018 12:25 PM End time: 09/26/2018 12:27 PM Staffing Anesthesiologist: Yevette Edwards, MD Resident/CRNA: Mathews Argyle, CRNA Performed: resident/CRNA  Preanesthetic Checklist Completed: patient identified, site marked, surgical consent, pre-op evaluation, timeout performed, IV checked, risks and benefits discussed and monitors and equipment checked Spinal Block Patient position: sitting Prep: DuraPrep Patient monitoring: heart rate, cardiac monitor, continuous pulse ox and blood pressure Approach: midline Location: L3-4 Injection technique: single-shot Needle Needle type: Sprotte  Needle gauge: 24 G Needle length: 9 cm Assessment Sensory level: T4

## 2018-09-26 NOTE — Progress Notes (Signed)
Report called to [place] at [facility]

## 2018-09-26 NOTE — Op Note (Addendum)
Cesarean Section Procedure Note  Date of procedure: 09/26/2018   Pre-operative Diagnosis: Intrauterine pregnancy at 1081w0d;  - Preterm premature rupture of membranes - Preterm labor - Footling breech - Anemia  Post-operative Diagnosis: same, delivered.  Procedure: Primary Classical Cesarean Section through Pfannenstiel incision  Surgeon: Christeen DouglasBethany Vincent Ehrler  Assistant(s):  Genia DelMargaret Haviland, CNM  Anesthesia: Spinal anesthesia  Estimated Blood Loss:  650ml         Total IV Fluids: see anesthesia   Urine Output: see anesthesia report         Specimens: Placenta         Complications:  None; patient tolerated the procedure well.         Disposition: PACU - hemodynamically stable.         Condition: stable  Findings: A female infant in footling breech presentation. Amniotic fluid - Clear  Birth weight 710 g.  Apgars of 2 and 8 at one and five minutes respectively.  Intact placenta with a three-vessel cord.  Grossly normal uterus, tubes and ovaries bilaterally. No intraabdominal adhesions were noted.  Indications:  38yo G3P2002 at 25+0wks by BPD at 23wks6days in an ER scan last week with an unknown LMP and a Depo shot given in 02/2018. On the scan last week, her infant was in cephalic presentation. She was leaking clear fluid in the ER that was determined to be urine from UTI.  She had her first OB visit with [facility] yesterday, and was leaking a little then. Yesterday evening she began leaking more clear fluid that started earlier in the day. She had been treated from the ER for BV and UTI, not fully completed by now and she had been taking keflex once daily instead of more frequently, though no dysuria now but some frequency. She denies prior cervical surgery, recent abdominal trauma, fundal tenderness, drug use except for THC and 1/4 pack cigarettes daily, or prior preterm birth.  She presented with continued copious clear fluid, which was positive for ROM+ and ffn positive. A  speculum exam noted 2-3cm dilation. Fetal heart rate was reassuring and appropriate for gestational age. She was afebrile without fundal tenderness, but WBC of 19.6, up from 11.3 8 days ago. She was given terbutaline and po indomethicin loading dose, her first dose of BMZ and magnesium for neuroprophalaxis. Her latency abx were started and a GBS returned positive. Her Covid test was negative.  In the middle of arranging transport to a higher level NICU, her contractions became painful. A repeat speculum exam noted fetal feet in the vagina and the decision was made to proceed with urgent cesarean delivery.  Procedure Details  The patient was taken to Operating Room, identified as the correct patient and the procedure verified as C-Section Delivery. A formal Time Out was held with all team members present and in agreement.  After induction of spinal anesthesia, the patient was draped and prepped in the usual sterile manner. A Pfannenstiel skin incision was made and carried down through the subcutaneous tissue to the fascia. Fascial incision was made and extended transversely with the Mayo scissors. The fascia was separated from the underlying rectus tissue superiorly and inferiorly. The peritoneum was identified and entered bluntly. Peritoneal incision was extended longitudinally. The utero-vesical peritoneal reflection was incised transversely and a bladder flap was created digitally.   Assessment of the uterus revealed a moderately developed LUS, and with the bladder out of the way, the decision was made to start with a low transverse incision. However, we quickly  converted to a T-uterine incision after breech maneuvers failed to deliver the fetal head. The fetus was delivered in a breech presentation as atraumatically as possible, using standard breech maneuvers, avoiding all pressure on her kidneys and joints, and without the [place]ce[place]. The umbilical cord was clamped x2 and cut and the infant  was handed to the awaiting pediatricians. Total time from uterine incision to delivery was 4 minutes. The placenta was removed intact and appeared ratty and tattered, small and with a 3-vessel cord.   The uterus was exteriorized and cleared of all clot and debris. The hysterotomy was closed with multilayer locked stiches of 0-Vicryl. The final layer was a baseball stitch using small Monocryl. After application of Arista and several figure of eights, excellent hemostasis was observed. The peritoneal cavity was cleared of all clots and debris. The uterus was returned to the abdomen. Interceed was placed and the peritoneum was re-approximated to contain protruding bowel and to decrease adhesion formation.  The pelvis was blotted and again, excellent hemostasis was noted. The fascia was then reapproximated with running sutures of 0 Vicryl.  The subcutaneous tissue was reapproximated with running sutures of Vicryl. The skin was reapproximated with a 4-0 Monocryl subcuticular stitch.  Exparel was used in standard fashion at fascial and skin lines.  Instrument, sponge, and needle counts were correct prior to the abdominal closure and at the conclusion of the case.   The patient tolerated the procedure well and was transferred to the recovery room in stable condition. All questions were answered from patient and family.  Her infant is being transported to [facility] in [place] and we will transfer her after her postop monitoring.  Christeen Douglas, MD 09/26/2018

## 2018-09-27 LAB — TYPE AND SCREEN
ABO/RH(D): A POS
Antibody Screen: NEGATIVE

## 2018-09-27 LAB — CBC
HCT: 24.6 % — ABNORMAL LOW (ref 36.0–46.0)
Hemoglobin: 8 g/dL — ABNORMAL LOW (ref 12.0–15.0)
MCH: 29 pg (ref 26.0–34.0)
MCHC: 32.5 g/dL (ref 30.0–36.0)
MCV: 89.1 fL (ref 80.0–100.0)
Platelets: 325 10*3/uL (ref 150–400)
RBC: 2.76 MIL/uL — ABNORMAL LOW (ref 3.87–5.11)
RDW: 13.2 % (ref 11.5–15.5)
WBC: 28.9 10*3/uL — ABNORMAL HIGH (ref 4.0–10.5)
nRBC: 0 % (ref 0.0–0.2)

## 2018-09-27 LAB — ABO/RH: ABO/RH(D): A POS

## 2018-09-27 LAB — RPR: RPR Ser Ql: NONREACTIVE

## 2018-09-27 LAB — HEPATITIS C ANTIBODY: HCV Ab: <0.1 s/co ratio (ref 0.0–0.9)

## 2018-09-27 LAB — URINE CULTURE: Culture: >=100000 — AB

## 2018-09-27 LAB — VARICELLA ZOSTER ANTIBODY, IGG: Varicella IgG: <135 index — ABNORMAL LOW (ref >165)

## 2018-09-27 LAB — RUBELLA ANTIBODY, IGM: Rubella IgM: <20 AU/mL (ref 0.0–19.9)

## 2018-09-27 NOTE — Anesthesia Postprocedure Evaluation (Signed)
Anesthesia Post Note  Patient: Shannon Ochoa  Procedure(s) Performed: CESAREAN SECTION (N/A Abdomen)  Anesthesia Type: Spinal Comments: Patient discharged to [facility] prior to anesthesia post-op evaluation     Last Vitals:  Vitals:   09/26/18 1714 09/26/18 1832  BP: 119/75 124/80  Pulse: 81 81  Resp: 16 16  Temp: 36.6 C 36.6 C  SpO2:      Last Pain:  Vitals:   09/26/18 1832  TempSrc: Oral  PainSc:                  [name]

## 2018-09-27 NOTE — Plan of Care (Signed)
Dressing change per Dr. Earlene Plater.

## 2018-09-27 NOTE — Clinical Social Work Maternal (Signed)
CLINICAL SOCIAL WORK MATERNAL/CHILD NOTE  Patient Details  Name: Shannon Ochoa MRN: [id number] Date of Birth: 08/03/1980  Date:  09/27/2018  Clinical Social Worker Initiating Note:  Angel Boyd-Gilyard Date/Time: Initiated:  09/27/18/1053     Child's Name:  Shannon Ochoa   Biological Parents:  Mother, Father(FOB is Javis Graves 06/13/1979)   Need for Interpreter:  None   Reason for Referral:  Current Substance Use/Substance Use During Pregnancy , Parental Support of Premature Babies < 32 weeks/or Critically Ill babies   Address:  [address] [place] [address]    Phone number:  [phone] (home)     Additional phone number:   Household Members/Support Persons (HM/SP):   Household Member/Support Person 1, Household Member/Support Person 2   HM/SP Name Relationship DOB or Age  HM/SP -1 Aumahn Hulsey daughter 10/15/2001  HM/SP -2 Amari Friede son 05/31/2006  HM/SP -3        HM/SP -4        HM/SP -5        HM/SP -6        HM/SP -7        HM/SP -8          Natural Supports (not living in the home):  Extended Family, Immediate Family, Parent, Friends(MOB reported that FOB's family will also provide support if needed.)   Professional Supports: None   Employment: Full-time   Type of Work: house keeping with [employer]   Education:  9 to 11 years   Homebound arranged: No  Financial Resources:  Medicaid   Other Resources:  WIC, Food Stamps    Cultural/Religious Considerations Which May Impact Care:  None reported  Strengths:  Ability to meet basic needs    Psychotropic Medications:         Pediatrician:       Pediatrician List:   [place]    [place]    [place]    [place]    [place]    [place]      Pediatrician Fax Number:    Risk Factors/Current Problems:  Substance Use    Cognitive State:  Alert , Able to Concentrate , Linear Thinking , Insightful    Mood/Affect:  Interested , Happy ,  Comfortable , Calm    CSW Assessment: CSW met with MOB at infant's bedside in room 302 to complete an assessment for NICU and admission and SA hx. When CSW arrived, MOB was resting in the recliner on the phone with a relative. MOB asked the call to call back at a later time after CSW introduced herself.  MOB was polite, easy to engage an forthcoming.   CSW asked MOB to share her story of labor and delivery as well as baby's admission to NICU and how she felt emotionally throughout her experience.  MOB was open to talking with CSW and sharing her feelings.  She states baby's admission to NICU was "scary" because she didn't want to be away from her baby, and she did not know what to expect;  but "knew she'd get the proper care there (NICU)."  CSW assisted her in identifying strengths, which she was able to.  She is pleased with baby's progress.    CSW provided education regarding PPD signs and symptoms to watch for and asked that MOB commit to talking with CSW and or her doctor if symptoms arise at any time.  She agreed.  CSW also discussed common emotions often experienced during   the first two weeks after delivery, keeping in mind the separation that is inevitably caused by baby's admission to NICU.  MOB denies any hx of depression, although she states she has had "little meltdowns" last night.  She does not think they were anything to be concerned about. CSW assessed for safety and MOB denied SI, HI, and DV.  MOB reports having a good support team that will assist if needed.    MOB reports having all needed baby supplies at home and has chosen a pediatrician.  She states no issues with transportation, however, concern over the cost of gas to get back and forth to the hospital as much as she will like after her own discharge tomorrow. CSW made MOB aware of rooming in with infant and MOB was happy to hear that as an option.  CSW asked about MOB's limited PNC and MOB reported, "I just found out I was  pregnant this week." CSW shared hospital's policy and MOB was understanding.  MOB admits to marijuana use during and stated, "If I would have known I was pregnant I would have never smoked." CSW made MOB aware of drug screens for infant and MOB denied the use of all other illicit substances. MOB also denied having CPS hx. CSW will monitor infant's UDS and CDS and will make a report if warranted.     MOB states no further questions, concerns or needs at this time.  CSW explained ongoing support services offered by NICU CSW and provided contact information.  MOB seemed very appreciative of the visit and thanked CSW.  CSW Plan/Description:  Psychosocial Support and Ongoing Assessment of Needs, Perinatal Mood and Anxiety Disorder (PMADs) Education, Other Patient/Family Education, Supplemental Security Income (SSI) Information, Hospital Drug Screen Policy Information, CSW Will Continue to Monitor Umbilical Cord Tissue Drug Screen Results and Make Report if Warranted   Angel Boyd-Gilyard, MSW, LCSW Clinical Social Work [phone]  ANGEL D BOYD-GILYARD, LCSW 09/27/2018, 11:00 AM  

## 2018-09-27 NOTE — Progress Notes (Signed)
Faculty Attending Note  Post Op Day 1  Subjective: Patient is feeling very well. She reports well controlled pain on PO pain meds. She is ambulating and denies light-headedness or dizziness. She is voiding spontaneously. She is not passing flatus, has not had a BM yet. She is tolerating a regular diet without nausea/vomiting. Bleeding is moderate. She is bottle feeding. Baby is in NICU and doing well.  Objective: Blood pressure 132/78, pulse 85, temperature 97.8 F (36.6 C), temperature source Oral, resp. rate 18, SpO2 99 %, unknown if currently breastfeeding. Temp:  [97 F (36.1 C)-98.8 F (37.1 C)] 97.8 F (36.6 C) (05/22 0804) Pulse Rate:  [76-90] 85 (05/22 0804) Resp:  [14-18] 18 (05/22 0804) BP: (119-147)/(74-111) 132/78 (05/22 0804) SpO2:  [99 %-100 %] 99 % (05/22 0804)  Physical Exam:  General: alert, oriented, cooperative Chest: normal respiratory effort Heart: RRR  Abdomen: soft, appropriately tender to palpation, incision covered by dressing with some seepage around dressing  Uterine Fundus: firm, 2 fingers below the umbilicus Lochia: moderate, rubra DVT Evaluation: no evidence of DVT Extremities: no edema, no calf tenderness  UOP: voiding spontaneously  Recent Labs    09/26/18 2053 09/27/18 0801  HGB 9.0* 8.0*  HCT 26.7* 24.6*    Assessment/Plan: Patient Active Problem List   Diagnosis Date Noted  . Leakage of amniotic fluid 09/26/2018  . Preterm premature rupture of membranes (PPROM) delivered, current hospitalization 09/26/2018  . Limited prenatal care 09/26/2018  . [place] breech presentation 09/26/2018  . Preterm labor 09/26/2018    Patient is 38 y.o. [id number] POD#1 s/p classical CS at [redacted]w[redacted]d for preterm labor and breech position. Antepartum course complicated by possible chronic HTN (no meds), limited care, THC use, PPROM. Post op course uncomplicated thus far. She is doing well, recovering appropriately and complains only of mild pain.   F/u am  CBC Continue routine post partum care Pain meds prn Regular diet Lovenox 40 mg daily [place] depo for birth control Plan for discharge likely tomorrow    K. Therese Sarah, M.D. Attending Center for [employer] (Faculty Practice)  09/27/2018, 8:27 AM

## 2018-09-27 NOTE — Progress Notes (Signed)
Upon return to unit from NICU, mom informed the RN that the NICU staff told her she should start using a breast pump in order to provide breast milk to "strengthen baby's stomach". Mom stated that she did not plan to breastfeed but would agree to pump if it would help baby. RN went to retrieve the breast pump kit and returned to begin the education. Mom was informed about pumping frequency and verbalized understanding. When the RN began to set up the actual breast pump, mom stated "I'm going to be honest with you, I'm not going to pump once I get home". RN asked the mom what changed and mom stated that she just wanted to bottle feed and preferred not to start baby on breast milk now when she would just switch her over to formula when she got home. RN informed mom about the benefits of breast milk and pumping. Mom verbalized understanding and decided that she would not like to start pumping.

## 2018-09-28 ENCOUNTER — Encounter (HOSPITAL_COMMUNITY): Payer: Self-pay | Admitting: *Deleted

## 2018-09-28 DIAGNOSIS — O9081 Anemia of the puerperium: Secondary | ICD-10-CM | POA: Diagnosis present

## 2018-09-28 LAB — COMPREHENSIVE METABOLIC PANEL
ALT: 11 U/L (ref 0–44)
AST: 11 U/L — ABNORMAL LOW (ref 15–41)
Albumin: 2.6 g/dL — ABNORMAL LOW (ref 3.5–5.0)
Alkaline Phosphatase: 68 U/L (ref 38–126)
Anion gap: 9 (ref 5–15)
BUN: 10 mg/dL (ref 6–20)
CO2: 22 mmol/L (ref 22–32)
Calcium: 8.6 mg/dL — ABNORMAL LOW (ref 8.9–10.3)
Chloride: 107 mmol/L (ref 98–111)
Creatinine, Ser: 0.65 mg/dL (ref 0.44–1.00)
GFR calc Af Amer: >60 mL/min (ref >60)
GFR calc non Af Amer: >60 mL/min (ref >60)
Glucose, Bld: 82 mg/dL (ref 70–99)
Potassium: 3.5 mmol/L (ref 3.5–5.1)
Sodium: 138 mmol/L (ref 135–145)
Total Bilirubin: 0.4 mg/dL (ref 0.3–1.2)
Total Protein: 5.6 g/dL — ABNORMAL LOW (ref 6.5–8.1)

## 2018-09-28 LAB — CBC
HCT: 23.6 % — ABNORMAL LOW (ref 36.0–46.0)
Hemoglobin: 7.5 g/dL — ABNORMAL LOW (ref 12.0–15.0)
MCH: 29.1 pg (ref 26.0–34.0)
MCHC: 31.8 g/dL (ref 30.0–36.0)
MCV: 91.5 fL (ref 80.0–100.0)
Platelets: 346 10*3/uL (ref 150–400)
RBC: 2.58 MIL/uL — ABNORMAL LOW (ref 3.87–5.11)
RDW: 13.3 % (ref 11.5–15.5)
WBC: 20.8 10*3/uL — ABNORMAL HIGH (ref 4.0–10.5)
nRBC: 0.1 % (ref 0.0–0.2)

## 2018-09-28 LAB — PROTEIN / CREATININE RATIO, URINE
Creatinine, Urine: 150.2 mg/dL
Protein Creatinine Ratio: 0.14 mg/mg{Cre} (ref 0.00–0.15)
Total Protein, Urine: 21 mg/dL

## 2018-09-28 LAB — RAPID URINE DRUG SCREEN, HOSP PERFORMED
Amphetamines: NOT DETECTED
Barbiturates: NOT DETECTED
Benzodiazepines: NOT DETECTED
Cocaine: NOT DETECTED
Opiates: NOT DETECTED
Tetrahydrocannabinol: POSITIVE — AB

## 2018-09-28 MED ORDER — HYDRALAZINE HCL 20 MG/ML IJ SOLN: 10.0000 mg | INTRAMUSCULAR | Status: DC | PRN

## 2018-09-28 MED ORDER — ENALAPRIL MALEATE 5 MG PO TABS
5.0000 mg | ORAL_TABLET | Freq: Every day | ORAL | Status: DC
  Administered 2018-09-29 – 2018-09-30 (×2): 5 mg via ORAL
  Filled 2018-09-28 (×2): qty 1

## 2018-09-28 MED ORDER — SODIUM CHLORIDE 0.9 % IV SOLN
510.0000 mg | Freq: Once | INTRAVENOUS | Status: AC
  Administered 2018-09-28: 14:00:00 510 mg via INTRAVENOUS
  Filled 2018-09-28: qty 17

## 2018-09-28 MED ORDER — LABETALOL HCL 5 MG/ML IV SOLN
20.0000 mg | INTRAVENOUS | Status: DC | PRN
  Administered 2018-09-29: 20 mg via INTRAVENOUS
  Filled 2018-09-28 (×2): qty 4

## 2018-09-28 MED ORDER — HYDRALAZINE HCL 20 MG/ML IJ SOLN
5.0000 mg | INTRAMUSCULAR | Status: DC | PRN
  Administered 2018-09-30: 5 mg via INTRAVENOUS
  Filled 2018-09-28: qty 1

## 2018-09-28 MED ORDER — LABETALOL HCL 5 MG/ML IV SOLN
40.0000 mg | INTRAVENOUS | Status: DC | PRN
  Filled 2018-09-28: qty 8

## 2018-09-28 NOTE — Progress Notes (Signed)
Faculty Practice Attending Note  I was called and informed of elevated BP readings for patient, after she returned from an extended visit outside her room.  BP 164/99 > 151/101 without intervention.  Patient denies any headaches, visual symptoms, RUQ/epigastric pain or other concerning symptoms.    Patient Vitals for the past 24 hrs:  BP Temp Temp src Pulse Resp SpO2 Height Weight  09/28/18 1809 (!) 151/101 - - - - - - -  09/28/18 1747 (!) 164/99 98.5 F (36.9 C) Oral 90 18 - - -  09/28/18 1340 (!) 131/94 - - 90 - - - -  09/28/18 1246 (!) 138/93 97.9 F (36.6 C) Oral 83 18 100 % - -  09/28/18 0945 (!) 146/95 97.8 F (36.6 C) Oral 90 18 100 % - -  09/28/18 0906 - - - - - - 5\' 2"  (1.575 m) 95.3 kg  09/28/18 0414 (!) 143/85 98.2 F (36.8 C) Oral 86 17 100 % - -  09/28/18 0019 136/87 98.4 F (36.9 C) Oral 85 18 100 % - -  09/27/18 2012 (!) 139/92 98.6 F (37 C) Oral 78 18 100 % - -   Patient has a history of possible CHTN, but concerned about superimposed postpartum preeclampsia or worsening postpartum hypertension.  Will check labs for now, including UDS. Had CBC earlier today showed normal platelets.  Enalapril 5 mg started for now; Hydralazine protocol ordered as needed for persistent BP >160/110. Will follow up labs and continue close observation.   Jaynie Collins, MD, FACOG Obstetrician & Gynecologist, [facility] for [employer], [facility]

## 2018-09-28 NOTE — Progress Notes (Signed)
Faculty Attending Note  Post Op Day 2  Subjective: Patient is feeling very well. She reports well controlled pain on PO pain meds. She is ambulating and denies light-headedness or dizziness. She is voiding spontaneously. She is passing flatus,no BM yet. She is tolerating a regular diet. Stable lochia. She is bottle feeding. Baby is stable in NICU .  Objective: Blood pressure (!) 146/95, pulse 90, temperature 97.8 F (36.6 C), temperature source Oral, resp. rate 18, height 5\' 2"  (1.575 m), weight 95.3 kg, SpO2 100 %, unknown if currently breastfeeding. Temp:  [97.8 F (36.6 C)-98.6 F (37 C)] 97.8 F (36.6 C) (05/23 0945) Pulse Rate:  [78-90] 90 (05/23 0945) Resp:  [17-20] 18 (05/23 0945) BP: (136-146)/(85-95) 146/95 (05/23 0945) SpO2:  [100 %] 100 % (05/23 0945) Weight:  [95.3 kg] 95.3 kg (05/23 0906)  Physical Exam:  General: alert, oriented, cooperative Chest: normal respiratory effort Heart: Regular rate Abdomen: soft, appropriately tender to palpation, incision covered by dressing with some old drainage Uterine Fundus: firm, 2 fingers below the umbilicus Lochia: moderate, rubra DVT Evaluation: no evidence of DVT Extremities: no edema, no calf tenderness  UOP: voiding spontaneously  Recent Labs    09/26/18 2053 09/27/18 0801  HGB 9.0* 8.0*  HCT 26.7* 24.6*    Assessment/Plan: Patient Active Problem List   Diagnosis Date Noted  . Postpartum anemia 09/28/2018  . Preterm premature rupture of membranes (PPROM) delivered, current hospitalization 09/26/2018  . Limited prenatal care 09/26/2018    Patient is 38 y.o. [id number] POD#2 s/p classical CS at [redacted]w[redacted]d for preterm labor and breech position. Antepartum course complicated by possible chronic HTN (no meds), limited care, THC use, PPROM. Post op course complicated by anemia. She is doing well, recovering appropriately and complains only of mild pain.   Feraheme ordered for anemia Continue to observe BP, may need  meds Pain meds prn Regular diet Lovenox 40 mg daily [place] Depo for birth control prior to discharge  Continue routine post partum care   Jaynie Collins, MD, FACOG Obstetrician & Gynecologist, [facility] for [employer], [facility]

## 2018-09-28 NOTE — Progress Notes (Signed)
   09/28/18 1747  Vital Signs  BP (!) 164/99  Dr. Macon Large notified of above BP and repeat of 151/101.  Dr. Macon Large will place orders.

## 2018-09-30 ENCOUNTER — Encounter (HOSPITAL_COMMUNITY): Payer: Self-pay | Admitting: *Deleted

## 2018-09-30 MED ORDER — NIFEDIPINE ER OSMOTIC RELEASE 30 MG PO TB24
30.0000 mg | ORAL_TABLET | Freq: Two times a day (BID) | ORAL | Status: DC
  Administered 2018-09-30 – 2018-10-01 (×2): 30 mg via ORAL
  Filled 2018-09-30 (×2): qty 1

## 2018-09-30 MED ORDER — ENALAPRIL MALEATE 10 MG PO TABS
10.0000 mg | ORAL_TABLET | Freq: Two times a day (BID) | ORAL | Status: DC
  Administered 2018-09-30 – 2018-10-01 (×2): 10 mg via ORAL
  Filled 2018-09-30 (×2): qty 1

## 2018-09-30 MED ORDER — ENALAPRIL MALEATE 10 MG PO TABS: 10.0000 mg | ORAL_TABLET | Freq: Every day | ORAL | 1 refills | Status: DC

## 2018-09-30 MED ORDER — ENALAPRIL MALEATE 5 MG PO TABS
5.0000 mg | ORAL_TABLET | Freq: Once | ORAL | Status: AC
  Administered 2018-09-30: 08:00:00 5 mg via ORAL
  Filled 2018-09-30: qty 1

## 2018-09-30 MED ORDER — ENALAPRIL MALEATE 5 MG PO TABS: 5.0000 mg | ORAL_TABLET | Freq: Two times a day (BID) | ORAL | Status: DC

## 2018-09-30 MED ORDER — OXYCODONE HCL 5 MG PO TABS: 5.0000 mg | ORAL_TABLET | ORAL | 0 refills | Status: DC | PRN

## 2018-09-30 NOTE — Progress Notes (Signed)
   09/30/18 0750 09/30/18 0802  Vital Signs  BP (!) 181/99 (!) 171/104  BP Location Left Arm  --   Patient Position (if appropriate) Lying  --   BP Method Automatic  --   Pulse Rate 95 91  Pulse Rate Source Monitor  --   Resp 18  --   Temp 98.2 F (36.8 C)  --   Temp Source Oral  --     Called Dr. Despina Hidden regarding above blood pressures.  See verbal order to administer 10 mg Vasotec now.

## 2018-09-30 NOTE — Progress Notes (Signed)
Faculty Attending Note  Post Op Day 4  Subjective: Patient was seen earlier today and discharged to home. This discharge was rescinded due to severe range BP noted.  Patient denies any headaches, visual symptoms, RUQ/epigastric pain or other concerning symptoms.  She received one dose of Hydralazine IV. She reports well controlled pain on PO pain meds. She is ambulating and denies light-headedness or dizziness. She is voiding spontaneously. She is passing flatus and had BM. She is tolerating a regular diet. Stable lochia. She is bottle feeding. Baby is stable in NICU .   Objective: Blood pressure (!) 150/96, pulse 90, temperature 98.1 F (36.7 C), temperature source Oral, resp. rate 16, height 5\' 2"  (1.575 m), weight 95.3 kg, SpO2 100 %, unknown if currently breastfeeding. Temp:  [98.1 F (36.7 C)-98.2 F (36.8 C)] 98.1 F (36.7 C) (05/25 1530) Pulse Rate:  [81-95] 90 (05/25 1530) Resp:  [16-18] 16 (05/25 1530) BP: (136-181)/(84-109) 150/96 (05/25 1530) SpO2:  [100 %] 100 % (05/25 0750)  Patient Vitals for the past 24 hrs:  BP Temp Temp src Pulse Resp SpO2  09/30/18 1530 (!) 150/96 98.1 F (36.7 C) Oral 90 16 -  09/30/18 1214 (!) 145/95 - - 92 - -  09/30/18 1045 (!) 150/95 - - 84 - -  09/30/18 0950 (!) 156/96 - - 81 - -  09/30/18 0916 (!) 166/109 - - 85 - -  09/30/18 0802 (!) 171/104 - - 91 - -  09/30/18 0750 (!) 181/99 98.2 F (36.8 C) Oral 95 18 100 %  09/30/18 0322 (!) 153/90 98.1 F (36.7 C) Oral 91 17 -  09/29/18 2151 136/84 - - 87 - -  09/29/18 1959 (!) 141/84 - - 83 - -   Physical Exam (by Dr. Despina HiddenEure):  General: alert, oriented, cooperative Chest: normal respiratory effort Heart: Regular rate Abdomen: soft, appropriately tender to palpation, incision covered by dressing with some old drainage Uterine Fundus: firm, 2 fingers below the umbilicus Lochia: moderate, rubra DVT Evaluation: no evidence of DVT Extremities: no edema, no calf tenderness  UOP: voiding  spontaneously  CBC Latest Ref Rng & Units 09/28/2018 09/27/2018 09/26/2018  WBC 4.0 - 10.5 K/uL 20.8(H) 28.9(H) 27.9(H)  Hemoglobin 12.0 - 15.0 g/dL 7.5(L) 8.0(L) 9.0(L)  Hematocrit 36.0 - 46.0 % 23.6(L) 24.6(L) 26.7(L)  Platelets 150 - 400 K/uL 346 325 321   CMP Latest Ref Rng & Units 09/28/2018 09/26/2018 09/18/2018  Glucose 70 - 99 mg/dL 82 - 89  BUN 6 - 20 mg/dL 10 - 8  Creatinine 1.610.44 - 1.00 mg/dL 0.960.65 0.450.59 4.090.52  Sodium 135 - 145 mmol/L 138 - 138  Potassium 3.5 - 5.1 mmol/L 3.5 - 3.9  Chloride 98 - 111 mmol/L 107 - 109  CO2 22 - 32 mmol/L 22 - 19(L)  Calcium 8.9 - 10.3 mg/dL 8.1(X8.6(L) - 8.8(L)  Total Protein 6.5 - 8.1 g/dL 9.1(Y5.6(L) - -  Total Bilirubin 0.3 - 1.2 mg/dL 0.4 - -  Alkaline Phos 38 - 126 U/L 68 - -  AST 15 - 41 U/L 11(L) - -  ALT 0 - 44 U/L 11 - -     Assessment/Plan: Patient Active Problem List   Diagnosis Date Noted  . Postpartum anemia 09/28/2018  . Preterm premature rupture of membranes (PPROM) delivered, current hospitalization 09/26/2018  . Limited prenatal care 09/26/2018    Patient is 38 y.o. [id number] POD#4 s/p classical CS at 6960w0d for preterm labor and breech position. Antepartum course complicated by possible chronic HTN (no  meds), limited care, THC use, PPROM. Post op course complicated by anemia and severe range HTN, no evidence of preeclampsia.   Enalapril increased to 10 mg po bid, Procardia XL 30 mg bid ordered. Continue to monitor BP, Already received Feraheme, on oral iron therapy now Pain meds prn Regular diet Lovenox 40 mg daily [place] Depo for birth control prior to discharge  Continue routine post partum care   Jaynie Collins, MD, FACOG Obstetrician & Gynecologist, [facility] for [employer], [facility]

## 2018-09-30 NOTE — Discharge Summary (Addendum)
Physician Discharge Summary  Patient ID: Shannon Ochoa MRN: [id number] DOB/AGE: [birth year] 37 y.o.  Admit date: 09/26/2018 Discharge date: 10/01/2018  Admission Diagnoses: S/P classicval Caesarean section at [facility] for 25 week breech PPROM, feet in vagina Transferred here since baby was transferred Discharge Diagnoses:  Principal Problem:   Preterm premature rupture of membranes (PPROM) delivered, current hospitalization Active Problems:   Postpartum anemia   Chronic hypertension   UTI in pregnancy   Discharged Condition: good  Hospital Course: Patient's post op course is unremarkable She did expereince BP elevation  Vasotec was titrated to 10 mg and procardia was subsequently added for optimal management  Consults: None  Significant Diagnostic Studies:   Treatments:   Discharge Exam: Blood pressure (!) 131/95, pulse 88, temperature 98.2 F (36.8 C), temperature source Oral, resp. rate 18, height 5\' 2"  (1.575 m), weight 95.3 kg, SpO2 100 %, unknown if currently breastfeeding. General appearance: alert, cooperative and no distress GI: soft, non-tender; bowel sounds normal; no masses,  no organomegaly Incision/Wound:intact clean dry  Disposition: Discharge disposition: 01-Home or Self Care       Discharge Instructions    Call MD for:  persistant nausea and vomiting   Complete by:  As directed    Call MD for:  severe uncontrolled pain   Complete by:  As directed    Call MD for:  temperature >100.4   Complete by:  As directed    Diet - low sodium heart healthy   Complete by:  As directed    Discharge patient   Complete by:  As directed    Discharge disposition:  01-Home or Self Care   Discharge patient date:  10/01/2018   Driving Restrictions   Complete by:  As directed    No driving for 1 week   Increase activity slowly   Complete by:  As directed    Leave dressing on - Keep it clean, dry, and intact until clinic visit   Complete by:  As directed     Lifting restrictions   Complete by:  As directed    Do not lift more than 10 pounds for 6 weeks   Sexual Activity Restrictions   Complete by:  As directed    No sex for 6 weeks     Allergies as of 10/01/2018   No Known Allergies     Medication List    TAKE these medications   enalapril 10 MG tablet Commonly known as:  VASOTEC Take 1 tablet (10 mg total) by mouth 2 (two) times daily for 30 days.   NIFEdipine 30 MG 24 hr tablet Commonly known as:  ADALAT CC Take 1 tablet (30 mg total) by mouth 2 (two) times daily for 30 days.   oxyCODONE 5 MG immediate release tablet Commonly known as:  Oxy IR/ROXICODONE Take 1-2 tablets (5-10 mg total) by mouth every 4 (four) hours as needed for moderate pain.   prenatal multivitamin Tabs tablet Take 1 tablet by mouth daily at 12 noon.      Follow-up Information    [facility] [employer] at [facility]. Schedule an appointment as soon as possible for a visit in 1 week(s).   Specialty:  Obstetrics and Gynecology Why:  blood pressure and incision check Contact information: [address] [place] [address] [phone]          Signed: Lazaro Arms 10/01/2018, 11:17 AM

## 2018-10-01 DIAGNOSIS — I10 Essential (primary) hypertension: Secondary | ICD-10-CM | POA: Diagnosis present

## 2018-10-01 DIAGNOSIS — O2340 Unspecified infection of urinary tract in pregnancy, unspecified trimester: Secondary | ICD-10-CM | POA: Diagnosis present

## 2018-10-01 LAB — SURGICAL PATHOLOGY

## 2018-10-01 MED ORDER — NIFEDIPINE ER 30 MG PO TB24: 30.0000 mg | ORAL_TABLET | Freq: Two times a day (BID) | ORAL | 0 refills | Status: AC

## 2018-10-01 MED ORDER — MEASLES, MUMPS & RUBELLA VAC IJ SOLR
0.5000 mL | Freq: Once | INTRAMUSCULAR | Status: AC
  Administered 2018-10-01: 09:00:00 0.5 mL via SUBCUTANEOUS

## 2018-10-01 MED ORDER — ENALAPRIL MALEATE 10 MG PO TABS: 10.0000 mg | ORAL_TABLET | Freq: Two times a day (BID) | ORAL | 0 refills | Status: AC

## 2018-10-01 NOTE — Discharge Instructions (Signed)

## 2018-10-01 NOTE — Progress Notes (Signed)
Faculty Attending Note  Post Op Day 3  Subjective: Patient is feeling very well. She reports well controlled pain on PO pain meds. She is ambulating and denies light-headedness or dizziness. She is voiding spontaneously. She is passing flatus,no BM yet. She is tolerating a regular diet. Stable lochia. She is bottle feeding. Baby is stable in NICU .  Objective: Blood pressure (!) 152/99, pulse 82, temperature 98.4 F (36.9 C), temperature source Oral, resp. rate 16, height 5\' 2"  (1.575 m), weight 95.3 kg, SpO2 100 %, unknown if currently breastfeeding. Had SBP in 160s earlier this morning.  Physical Exam:  General: alert, oriented, cooperative Chest: normal respiratory effort Heart: Regular rate Abdomen: soft, appropriately tender to palpation, incision covered by dressing with some old drainage Uterine Fundus: firm, 2 fingers below the umbilicus Lochia: moderate, rubra DVT Evaluation: no evidence of DVT Extremities: no edema, no calf tenderness  UOP: voiding spontaneously  CBC Latest Ref Rng & Units 09/28/2018 09/27/2018 09/26/2018  WBC 4.0 - 10.5 K/uL 20.8(H) 28.9(H) 27.9(H)  Hemoglobin 12.0 - 15.0 g/dL 7.5(L) 8.0(L) 9.0(L)  Hematocrit 36.0 - 46.0 % 23.6(L) 24.6(L) 26.7(L)  Platelets 150 - 400 K/uL 346 325 321   CMP Latest Ref Rng & Units 09/28/2018 09/26/2018 09/18/2018  Glucose 70 - 99 mg/dL 82 - 89  BUN 6 - 20 mg/dL 10 - 8  Creatinine 6.96 - 1.00 mg/dL 2.95 2.84 1.32  Sodium 135 - 145 mmol/L 138 - 138  Potassium 3.5 - 5.1 mmol/L 3.5 - 3.9  Chloride 98 - 111 mmol/L 107 - 109  CO2 22 - 32 mmol/L 22 - 19(L)  Calcium 8.9 - 10.3 mg/dL 4.4(W) - 8.8(L)  Total Protein 6.5 - 8.1 g/dL 1.0(U) - -  Total Bilirubin 0.3 - 1.2 mg/dL 0.4 - -  Alkaline Phos 38 - 126 U/L 68 - -  AST 15 - 41 U/L 11(L) - -  ALT 0 - 44 U/L 11 - -    Assessment/Plan: Patient Active Problem List   Diagnosis Date Noted  . Chronic hypertension 10/01/2018  . Postpartum anemia 09/28/2018  . Preterm premature  rupture of membranes (PPROM) delivered, current hospitalization 09/26/2018  . Limited prenatal care 09/26/2018    Patient is 38 y.o. [id number] POD#3 s/p classical CS at [redacted]w[redacted]d for preterm labor and breech position. Antepartum course complicated by possible chronic HTN (was on no meds), limited care, THC use, PPROM. Post op course complicated by anemia and now severe blood pressures, with negative labs yesterday and no symptoms of preeclampsia. Patient denies any headaches, visual symptoms, RUQ/epigastric pain or other concerning symptoms.   Enalapril initiated for severe range BP; will consider magnesium sulfate if she develops any neurologic symptoms or other concerning symptoms/abnormal labs. Continue to observe BP and increase medication as needed S/p Feraheme for anemia Pain meds prn Regular diet Lovenox 40 mg daily [place] Depo for birth control prior to discharge  Continue routine post partum care   Jaynie Collins, MD, FACOG Obstetrician & Gynecologist, [facility] for [employer], [facility]

## 2018-10-01 NOTE — Progress Notes (Signed)
CSW met with MOB in room 113. When CSW arrived, MOB was cleaning room in preparation for discharge.  FOB was also present.  CSW introduced herself to FOB and MOB gave CSW permission to meet with MOB while FOB was present.   CSW made MOB aware that CSW had received messages regarding MOB's request to meet with CSW.  MOB informed CSW that she is discharging today and transportation is barrier for MOB to return to the hospital.  CSW assisted MOB and FOB with exploring resources that can assist MOB with transportation.  CSW encouraged the family to apply for Medicaid Transportation and explained the application process; MOB agreed to apply. CSW also communicated with the family that when gas cards become available again, CSW will assist with gas cards; the family was appreciative. CSW assessed for other psychosocial stressors and barriers. MOB acknowledged that she may also need assistant with food if she decides to room in with infant.  CSW agreed to provide MOB with daily meal vouchers and snacks if they are available.   CSW made MOB aware that infant is eligible for SSI benefits.  MOB and FOB expressed interest in applying and CSW provided them with the process information.  Required documents were signed and MOB agreed to call SSI today.  CSW will assist MOB if needed.   CSW will continue to offer family resources and supports while infant remains in NICU.   Laurey Arrow, MSW, LCSW Clinical Social Work [phone]

## 2018-10-01 NOTE — Discharge Summary (Signed)
error 

## 2018-10-01 NOTE — Plan of Care (Signed)
Pt's BP range 141-148/93-97. Pt denies headache, blurred vision, and epigastric pain. Pt medicated x2 for incisional pain with good relief. Pt ambulating without difficulty.   Problem: Clinical Measurements: Goal: Ability to maintain clinical measurements within normal limits will improve Outcome: Progressing Goal: Cardiovascular complication will be avoided Outcome: Progressing

## 2018-10-08 ENCOUNTER — Ambulatory Visit: Payer: Self-pay

## 2018-10-14 ENCOUNTER — Ambulatory Visit: Payer: Self-pay

## 2018-11-01 ENCOUNTER — Other Ambulatory Visit (HOSPITAL_COMMUNITY): Payer: Self-pay | Admitting: Family Medicine

## 2018-11-01 LAB — HEMOGLOBIN, FINGERSTICK: Hemoglobin: 10.9 g/dL — ABNORMAL LOW (ref 11.1–15.9)

## 2018-12-14 ENCOUNTER — Encounter: Payer: Self-pay | Admitting: Emergency Medicine

## 2018-12-14 ENCOUNTER — Emergency Department
Admission: EM | Admit: 2018-12-14 | Discharge: 2018-12-14 | Disposition: A | Discharge status: Home / Self Care | Payer: Medicaid Other | Attending: Emergency Medicine | Admitting: Emergency Medicine

## 2018-12-14 ENCOUNTER — Other Ambulatory Visit: Payer: Self-pay

## 2018-12-14 DIAGNOSIS — H9209 Otalgia, unspecified ear: Secondary | ICD-10-CM | POA: Insufficient documentation

## 2018-12-14 DIAGNOSIS — Z5321 Procedure and treatment not carried out due to patient leaving prior to being seen by health care provider: Secondary | ICD-10-CM | POA: Insufficient documentation

## 2018-12-14 NOTE — ED Notes (Signed)
First Nurse Note: Pt to ED c/o ear pain. Pt is in NAD

## 2018-12-14 NOTE — ED Triage Notes (Signed)
Pt to ED from home c/o left tooth radiating to left ear since Thursday. Painful chewing.  Speaking in complete and coherent sentences, chest rise even and unlabored, in NAD at this time.

## 2018-12-24 ENCOUNTER — Ambulatory Visit (LOCAL_COMMUNITY_HEALTH_CENTER): Payer: Medicaid Other

## 2018-12-24 ENCOUNTER — Other Ambulatory Visit: Payer: Self-pay

## 2018-12-24 VITALS — BP 152/105 | Ht 65.0 in | Wt 184.0 lb

## 2018-12-24 DIAGNOSIS — Z30013 Encounter for initial prescription of injectable contraceptive: Secondary | ICD-10-CM

## 2018-12-24 DIAGNOSIS — Z3009 Encounter for other general counseling and advice on contraception: Secondary | ICD-10-CM

## 2018-12-24 MED ORDER — MEDROXYPROGESTERONE ACETATE 150 MG/ML IM SUSP
150.0000 mg | Freq: Once | INTRAMUSCULAR | Status: AC
  Administered 2018-12-24: 15:00:00 150 mg via INTRAMUSCULAR

## 2018-12-24 NOTE — Progress Notes (Signed)
Although order in medication list indicates Vasotec expired 10/31/2018, client still has some remaining in bottle. No longer taking Nifedipine. Client had post partum check up 10/2018 at [facility] and desired Depo for [facility] (received in hospital after delivery at ~ 25 weeks). Consult with Dr. Ernestina Patches today for Depo order and may have Depo 150 mg IM  q 11 - 13 weeks through 10/2019 (when next physical due). Dr. Ernestina Patches aware client's BP today = 152/105 (repeat 10 minutes later = 142/104) and may administer Depo today. Client tolerated Depo without complaint. Client counseled regarding need for PCP evaluaion of BP. [facility] info sheet given. Client currently has Medicaid. Shona Needles, RN

## 2019-02-21 ENCOUNTER — Other Ambulatory Visit: Payer: Self-pay

## 2019-02-21 DIAGNOSIS — Z20822 Contact with and (suspected) exposure to covid-19: Secondary | ICD-10-CM

## 2019-02-23 LAB — NOVEL CORONAVIRUS, NAA: SARS-CoV-2, NAA: NOT DETECTED

## 2019-03-11 ENCOUNTER — Ambulatory Visit (LOCAL_COMMUNITY_HEALTH_CENTER): Payer: Medicaid Other

## 2019-03-11 ENCOUNTER — Other Ambulatory Visit: Payer: Self-pay

## 2019-03-11 DIAGNOSIS — Z3009 Encounter for other general counseling and advice on contraception: Secondary | ICD-10-CM

## 2019-03-11 DIAGNOSIS — Z30013 Encounter for initial prescription of injectable contraceptive: Secondary | ICD-10-CM

## 2019-03-11 MED ORDER — MEDROXYPROGESTERONE ACETATE 150 MG/ML IM SUSP
150.0000 mg | Freq: Once | INTRAMUSCULAR | Status: AC
  Administered 2019-03-11: 16:00:00 150 mg via INTRAMUSCULAR

## 2019-03-11 NOTE — Progress Notes (Signed)
Last depo at [facility] 12/24/2018; 11.0 weeks post depo today. Reference DMPA 150 mg IM order by Dr. Lauretta Chester dated 12/24/2018 for depo until RP due 10/2019. Consulted with provider regarding pt's BP. Per verbal order by Antoine Primas, PA, administered DMPA 150 mg IM today; and pt instructed to retake BP after abstaining from tobacco for at least 2 hours, if BP remains high, see PCP regarding BP medication.

## 2019-06-12 ENCOUNTER — Other Ambulatory Visit: Payer: Self-pay

## 2019-06-12 ENCOUNTER — Ambulatory Visit (LOCAL_COMMUNITY_HEALTH_CENTER): Payer: Medicaid Other

## 2019-06-12 VITALS — BP 149/104 | Ht 65.0 in | Wt 211.5 lb

## 2019-06-12 DIAGNOSIS — Z3009 Encounter for other general counseling and advice on contraception: Secondary | ICD-10-CM

## 2019-06-12 DIAGNOSIS — Z30013 Encounter for initial prescription of injectable contraceptive: Secondary | ICD-10-CM | POA: Diagnosis not present

## 2019-06-12 DIAGNOSIS — Z3042 Encounter for surveillance of injectable contraceptive: Secondary | ICD-10-CM

## 2019-06-12 MED ORDER — MEDROXYPROGESTERONE ACETATE 150 MG/ML IM SUSP
150.0000 mg | Freq: Once | INTRAMUSCULAR | Status: AC
  Administered 2019-06-12: 14:00:00 150 mg via INTRAMUSCULAR

## 2019-06-12 NOTE — Progress Notes (Signed)
13.2 weeks post depo today.  (See DMPA 150 mg IM per Dr. Cala Bradford Newtons order dated 12/24/2018 for depo until RP due 10/2019.)  Pt states she had cigarette before coming into [facility] and drinks Pepsi every day all day; denies headache, blurred vision, and dizziness. Pt states she has been to see Phineas Real 3x for BP and is taking BP medicine, sometimes it is a high reading and sometimes not. Consulted with provider regarding BP and pt details; per verbal order by Sadie Haber, PA, administered DMPA 150 mg IM and counseled pt to abstain from smoking and caffeine products for at least 2 hours prior to her appointments.

## 2019-09-18 ENCOUNTER — Other Ambulatory Visit: Payer: Self-pay

## 2019-09-18 ENCOUNTER — Ambulatory Visit (LOCAL_COMMUNITY_HEALTH_CENTER): Payer: Medicaid Other

## 2019-09-18 VITALS — BP 172/105 | Ht 65.0 in | Wt 213.6 lb

## 2019-09-18 DIAGNOSIS — Z3009 Encounter for other general counseling and advice on contraception: Secondary | ICD-10-CM

## 2019-09-18 DIAGNOSIS — Z30013 Encounter for initial prescription of injectable contraceptive: Secondary | ICD-10-CM | POA: Diagnosis not present

## 2019-09-18 MED ORDER — MEDROXYPROGESTERONE ACETATE 150 MG/ML IM SUSP
150.0000 mg | Freq: Once | INTRAMUSCULAR | Status: AC
  Administered 2019-09-18: 150 mg via INTRAMUSCULAR

## 2019-09-18 NOTE — Progress Notes (Signed)
Declines MVI at this time. Consult with Samara Snide PA - C regarding BP and Depo order. Per Ms. Staples verbal order: 1) encourage client to notify PCP of elevated BP reading, 2) counsel Depo given with elevated BP can increase chance of stroke and 3) if client desires, may administer Depo 150 mg IM x1 today. Above provider orders completed and client tolerated Depo injection without complaint. Client states she will call her doctor and if begins to feel bad will go to the emergency room. Client counseled needs physical at next Depo appt and verbalizes understanding. Jossie Ng, RN

## 2019-12-10 ENCOUNTER — Other Ambulatory Visit: Payer: Self-pay

## 2019-12-10 ENCOUNTER — Ambulatory Visit (LOCAL_COMMUNITY_HEALTH_CENTER): Payer: Medicaid Other | Admitting: Physician Assistant

## 2019-12-10 VITALS — BP 199/127 | Ht 66.0 in | Wt 203.0 lb

## 2019-12-10 DIAGNOSIS — Z5321 Procedure and treatment not carried out due to patient leaving prior to being seen by health care provider: Secondary | ICD-10-CM

## 2019-12-10 NOTE — Progress Notes (Signed)
Consulted by RN re:  Patient with BP 199/127.  Recommend that patient be seen by ER or PCP ASAP to lower BP.  Enc patient to take BP meds as directed each day.  Enc patient to call and reschedule for RP and Depo once BP is under control.

## 2019-12-10 NOTE — Progress Notes (Signed)
Pt's BP is 199/127. Pt reports that she hasn't taken her BP medicine yet today and that she smoked a cigarette and drank a soda prior to coming. Pt denies any headaches, chest pain, blurred vision, or any other symptoms. Pt states that she feels fine. Consulted with Sadie Haber, PA who reports that she strongly recommends pt to go to the ER or her Doctor and take her BP medicine, as BP is dangerously high. Counseled pt per Sadie Haber, PA and pt agrees to go to ER at [facility], and states she will call back to reschedule her physical and Depo appt.

## 2020-01-02 IMAGING — CT CT HEAD W/O CM
3 series · 16 of 47 positions shown, 19 images · non-contrast
Comparison: None.

CLINICAL DATA: Mild headaches.  Nasal congestion.

EXAM:
CT HEAD WITHOUT CONTRAST
TECHNIQUE: Contiguous axial images were obtained from the base of the skull
through the vertex without intravenous contrast.

[Series 2: head wo · axial · 0.43mm/px · z∈[-96,+29]mm · 10 of 30 slices shown, 13 images]
[im 3/30  brain]
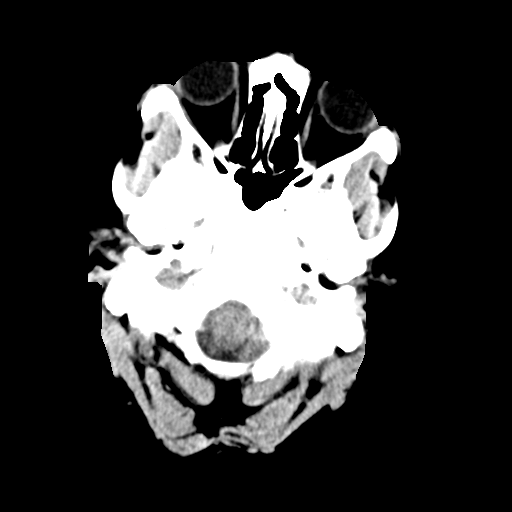
[im 3/30  bone]
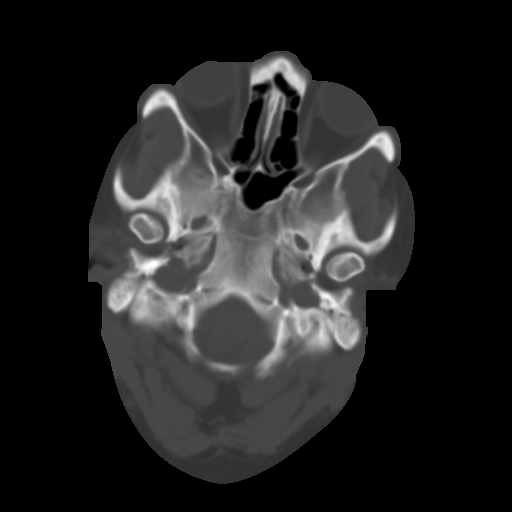
[im 6/30  brain]
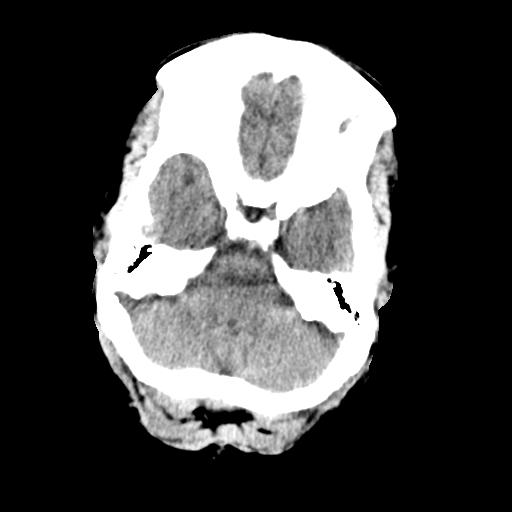
[im 9/30  brain]
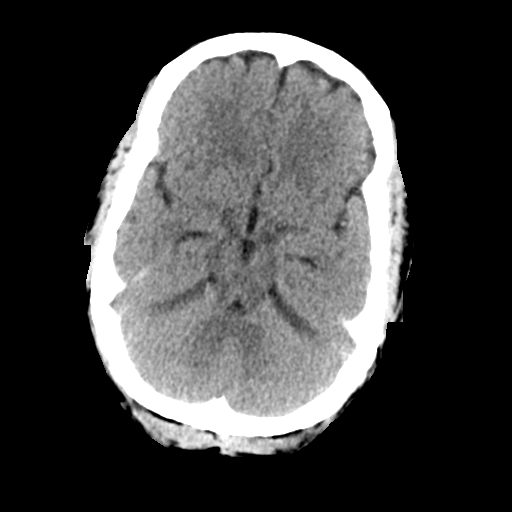
[im 11/30  brain]
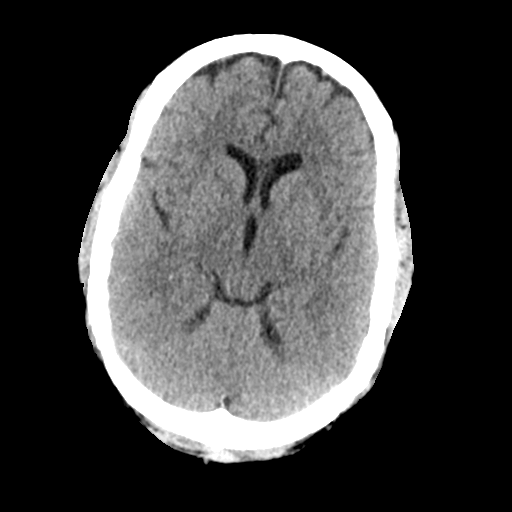
[im 14/30  brain]
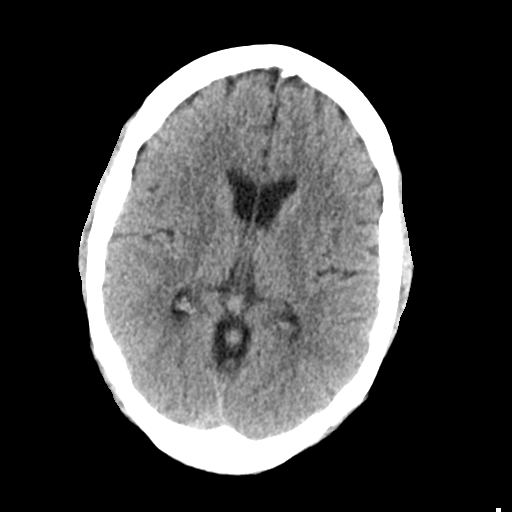
[im 14/30  bone]
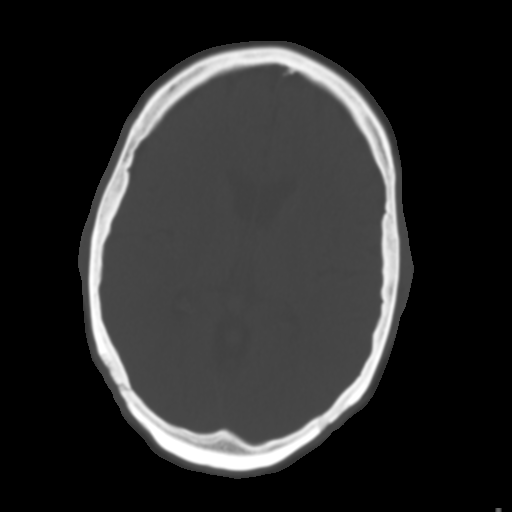
[im 17/30  brain]
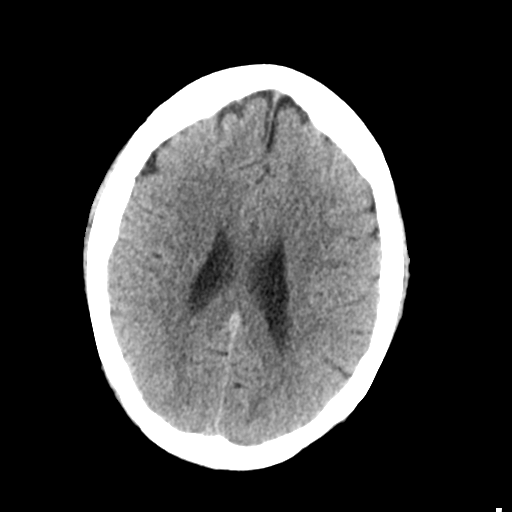
[im 20/30  brain]
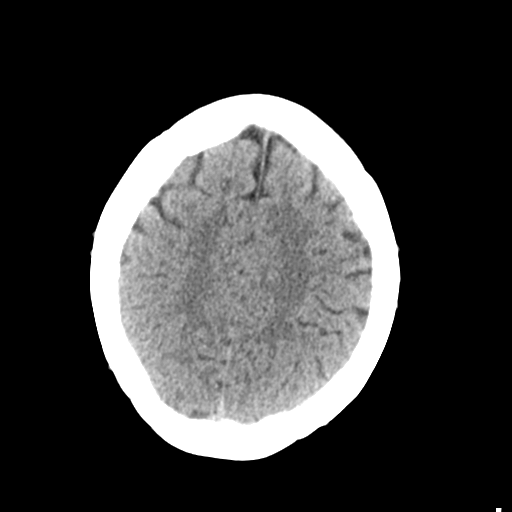
[im 23/30  brain]
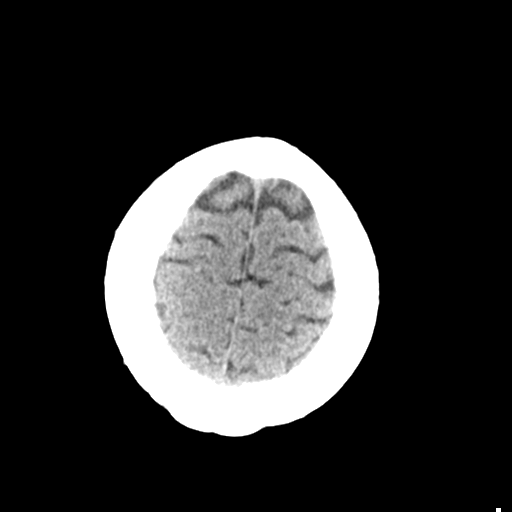
[im 25/30  brain]
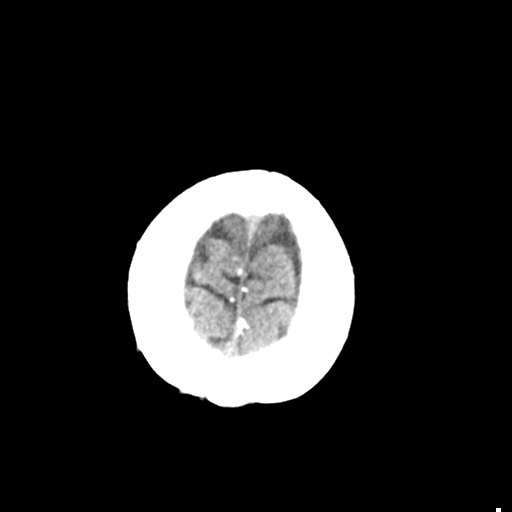
[im 25/30  bone]
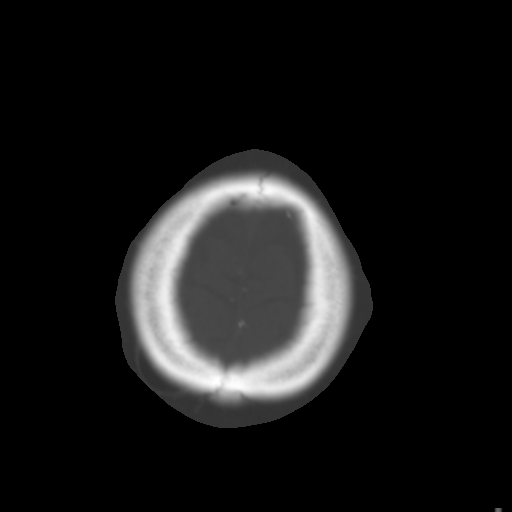
[im 28/30  brain]
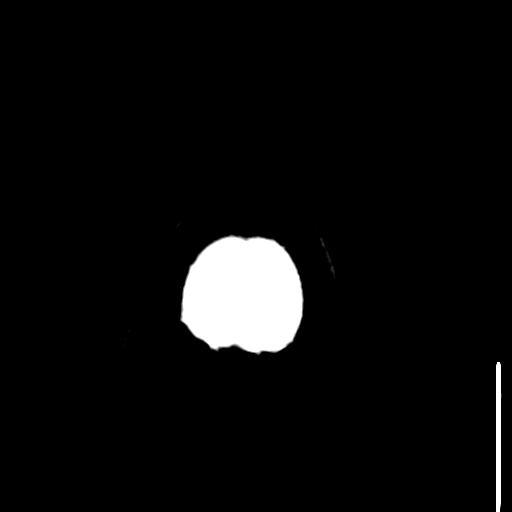

[Series 4: sagittal soft tissue · sagittal · 0.34mm/px · 3 of 55 slices shown]
[im 19/55  brain]
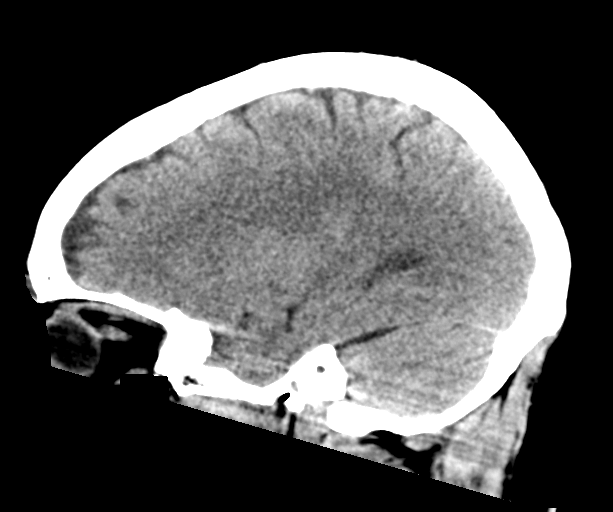
[im 28/55  brain]
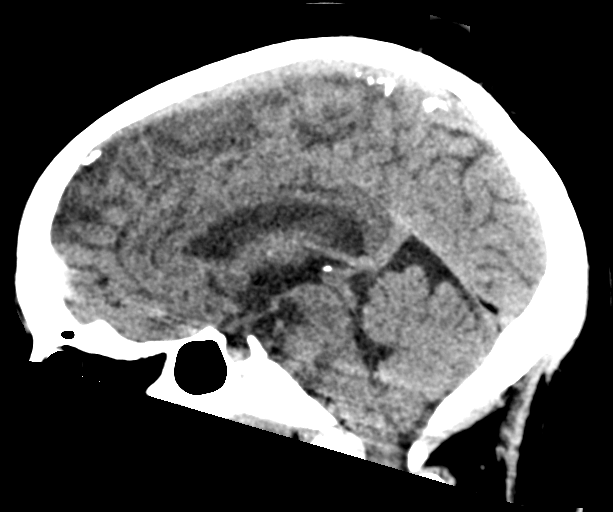
[im 37/55  brain]
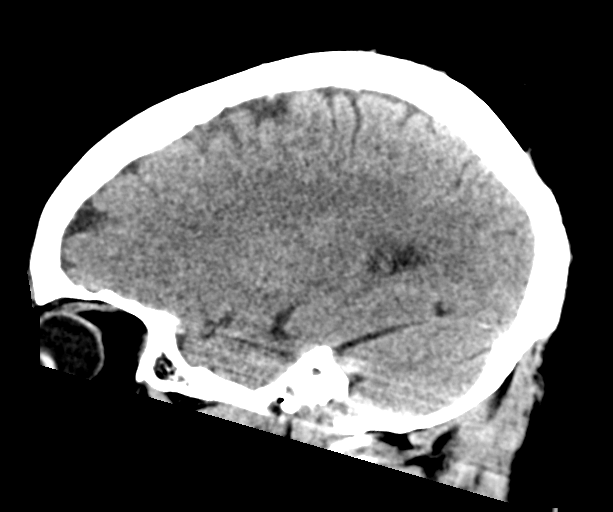

[Series 5: coronal soft tissue · coronal · 0.32mm/px · 3 of 69 slices shown]
[im 23/69  brain]
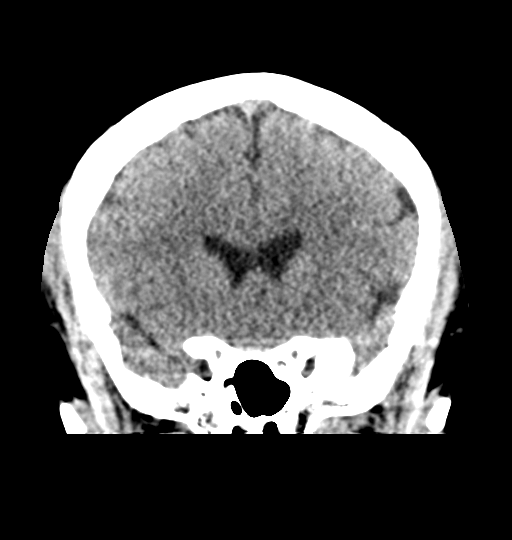
[im 31/69  brain]
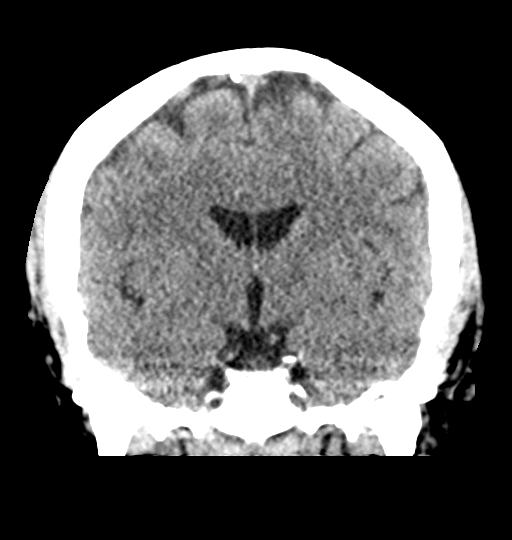
[im 38/69  brain]
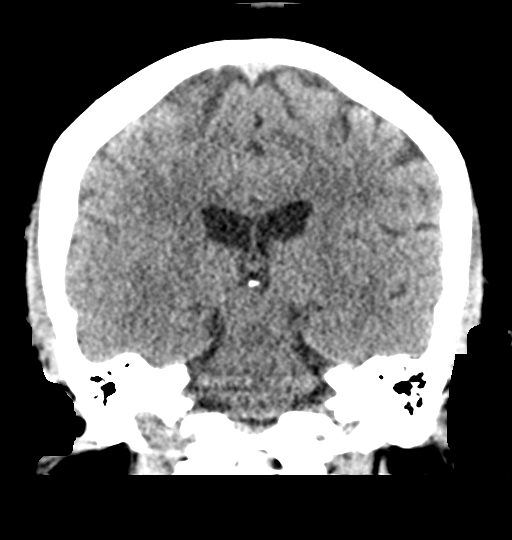

[16 of 47 positions shown; findings below may reference images not displayed]

FINDINGS: Brain: Ventricles and sulci are appropriate for patient's age. No
evidence for acute cortically based infarct, intracranial
hemorrhage, mass lesion or mass-effect.

Vascular: Unremarkable

Skull: Intact.

Sinuses/Orbits: Paranasal sinuses are well aerated. Mastoid air
cells unremarkable. Orbits are unremarkable.

Other: None.
IMPRESSION: No acute intracranial process.

## 2020-01-05 ENCOUNTER — Ambulatory Visit: Payer: Medicaid Other

## 2020-01-07 ENCOUNTER — Ambulatory Visit: Payer: Medicaid Other

## 2021-04-08 ENCOUNTER — Encounter: Payer: Self-pay | Admitting: Emergency Medicine

## 2021-04-08 DIAGNOSIS — L0211 Cutaneous abscess of neck: Secondary | ICD-10-CM | POA: Insufficient documentation

## 2021-04-08 DIAGNOSIS — F17210 Nicotine dependence, cigarettes, uncomplicated: Secondary | ICD-10-CM | POA: Insufficient documentation

## 2021-04-08 DIAGNOSIS — Z79899 Other long term (current) drug therapy: Secondary | ICD-10-CM | POA: Diagnosis not present

## 2021-04-08 DIAGNOSIS — R221 Localized swelling, mass and lump, neck: Secondary | ICD-10-CM | POA: Diagnosis present

## 2021-04-08 DIAGNOSIS — I10 Essential (primary) hypertension: Secondary | ICD-10-CM | POA: Insufficient documentation

## 2021-04-08 NOTE — ED Triage Notes (Signed)
Pt in with abcess to midline posterior neck, half-dollar size. Hard and warm to touch. Pt states she first noticed it 5 days ago, and it has increased in size and pain since then. No fevers reported. HTN in triage - 191/126

## 2021-04-09 ENCOUNTER — Emergency Department
Admission: EM | Admit: 2021-04-09 | Discharge: 2021-04-09 | Disposition: A | Discharge status: Home / Self Care | Payer: Medicaid Other | Attending: Emergency Medicine | Admitting: Emergency Medicine

## 2021-04-09 ENCOUNTER — Emergency Department: Payer: Medicaid Other

## 2021-04-09 DIAGNOSIS — L0291 Cutaneous abscess, unspecified: Secondary | ICD-10-CM

## 2021-04-09 DIAGNOSIS — R609 Edema, unspecified: Secondary | ICD-10-CM

## 2021-04-09 MED ORDER — LIDOCAINE HCL (PF) 1 % IJ SOLN
5.0000 mL | Freq: Once | INTRAMUSCULAR | Status: AC
  Administered 2021-04-09: 5 mL
  Filled 2021-04-09: qty 5

## 2021-04-09 MED ORDER — CEPHALEXIN 500 MG PO CAPS: 500.0000 mg | ORAL_CAPSULE | Freq: Four times a day (QID) | ORAL | 0 refills | Status: AC

## 2021-04-09 NOTE — Discharge Instructions (Signed)
Please seek medical attention for any high fevers, chest pain, shortness of breath, change in behavior, persistent vomiting, bloody stool or any other new or concerning symptoms.  

## 2021-04-09 NOTE — ED Provider Notes (Addendum)
[facility] Emergency Department Provider Note  ____________________________________________   I have reviewed the triage vital signs and the nursing notes.   HISTORY  Chief Complaint Abscess   History limited by: Not Limited   HPI Shannon Ochoa is a 40 y.o. female who presents to the emergency department today because of concerns for area of swelling and pain to the back of her neck.  She states she first noticed a small bump there a couple of weeks ago.  It was not until a few days ago when it really started to swell and become painful.  Patient denies any trauma to her neck.  She denies similar symptoms in the past.  She has not noticed any fevers, nausea or vomiting.  Records reviewed. Per medical record review patient has a history of HTN.  Past Medical History:  Diagnosis Date   Gestational diabetes    Medical history non-contributory     Patient Active Problem List   Diagnosis Date Noted   Chronic hypertension 10/01/2018   UTI in pregnancy 10/01/2018   Postpartum anemia 09/28/2018   Preterm premature rupture of membranes (PPROM) delivered, current hospitalization 09/26/2018   Limited prenatal care 09/26/2018   Abnormal cervical Papanicolaou smear 05/15/2017    Past Surgical History:  Procedure Laterality Date   CESAREAN SECTION N/A 09/26/2018   Procedure: CESAREAN SECTION;  Surgeon: Christeen Douglas, MD;  Location: [facility] ORS;  Service: Obstetrics;  Laterality: N/A;  Female @ 1249   NO PAST SURGERIES      Prior to Admission medications   Medication Sig Start Date End Date Taking? Authorizing Provider  enalapril (VASOTEC) 10 MG tablet Take 1 tablet (10 mg total) by mouth 2 (two) times daily for 30 days. 10/01/18 10/31/18  Linwood Bing, MD  enalapril (VASOTEC) 10 MG tablet Take 10 mg by mouth daily.    [provider]  ferrous sulfate 325 (65 FE) MG tablet Take 325 mg by mouth daily. Unsure of dose.    [provider]   NIFEdipine (ADALAT CC) 30 MG 24 hr tablet Take 1 tablet (30 mg total) by mouth 2 (two) times daily for 30 days. 10/01/18 10/31/18   Bing, MD  oxyCODONE (OXY IR/ROXICODONE) 5 MG immediate release tablet Take 1-2 tablets (5-10 mg total) by mouth every 4 (four) hours as needed for moderate pain. Patient not taking: Reported on 12/24/2018 09/30/18   Lazaro Arms, MD  Prenatal Vit-Fe Fumarate-FA (PRENATAL MULTIVITAMIN) TABS tablet Take 1 tablet by mouth daily at 12 noon.    [provider]    Allergies Patient has no known allergies.  Family History  Problem Relation Age of Onset   Diabetes Mother    Hypertension Mother    Hypertension Father    Hypertension Sister    Diabetes Sister    Asthma Son    Cirrhosis Maternal Aunt    Cirrhosis Maternal Uncle    Diabetes Maternal Grandmother    Throat cancer Maternal Grandfather    Lung cancer Maternal Grandfather    Cirrhosis Maternal Grandfather    Breast cancer Paternal Grandmother     Social History Social History   Tobacco Use   Smoking status: Every Day    Packs/day: 0.25    Types: Cigarettes   Smokeless tobacco: Never  Substance Use Topics   Alcohol use: No   Drug use: Yes    Types: Marijuana    Review of Systems Constitutional: No fever/chills Eyes: No visual changes. ENT: No sore throat.  Cardiovascular: Denies chest pain. Respiratory: Denies shortness of breath. Gastrointestinal: No abdominal pain.  No nausea, no vomiting.  No diarrhea.   Genitourinary: Negative for dysuria. Musculoskeletal: Negative for back pain. Skin: Positive for swelling and pain to back of neck. Neurological: Negative for headaches, focal weakness or numbness.  ____________________________________________   PHYSICAL EXAM:  VITAL SIGNS: ED Triage Vitals  Enc Vitals Group     BP 04/08/21 2331 (!) 191/126     Pulse Rate 04/08/21 2331 86     Resp 04/08/21 2331 18     Temp 04/08/21 2331 98.7 F (37.1 C)     Temp Source  04/08/21 2331 Oral     SpO2 04/08/21 2331 97 %     Weight 04/08/21 2332 180 lb (81.6 kg)   Constitutional: Alert and oriented.  Eyes: Conjunctivae are normal.  ENT      Head: Normocephalic and atraumatic.      Nose: No congestion/rhinnorhea.      Mouth/Throat: Mucous membranes are moist.      Neck: No stridor. Hematological/Lymphatic/Immunilogical: No cervical lymphadenopathy. Cardiovascular: Normal rate, regular rhythm.  No murmurs, rubs, or gallops.  Respiratory: Normal respiratory effort without tachypnea nor retractions. Breath sounds are clear and equal bilaterally. No wheezes/rales/rhonchi. Gastrointestinal: Soft and non tender. No rebound. No guarding.  Genitourinary: Deferred Musculoskeletal: Normal range of motion in all extremities. No lower extremity edema. Neurologic:  Normal speech and language. No gross focal neurologic deficits are appreciated.  Skin:  Roughly 3 cm diameter area of swelling to back of neck. Tender. No overlying skin change. Slightly firm, not adherent to underlying structures.  Psychiatric: Mood and affect are normal. Speech and behavior are normal. Patient exhibits appropriate insight and judgment.  ____________________________________________    LABS (pertinent positives/negatives)  None  ____________________________________________   EKG  None  ____________________________________________    RADIOLOGY  US soft tissue IMPRESSION:  Superficial complex cyst and/or abscess measuring up to 2.1 cm  diameter. Superficial lesion margin is only about 2 mm deep to the  skin surface, such that superinfected sebaceous cyst or similar  etiology is favored.    ____________________________________________   PROCEDURES  Procedures  Incision and Drainage of Abscess Location: Posterior neck Anesthesia Local: 1% Lidocaine   Prep/Procedure: Skin Prep: Chlorahex Incised abscess with #11 blade Purulent discharge: large Probed to break up  loculations Packed with 1/4" gauze Estimated blood loss: 1 ml  ____________________________________________   INITIAL IMPRESSION / ASSESSMENT AND PLAN / ED COURSE  Pertinent labs & imaging results that were available during my care of the patient were reviewed by me and considered in my medical decision making (see chart for details).   Patient presented to the emergency department today because of concerns for swelling and pain to the back of her neck.  On exam it is not a classic abscess in terms of physical exam without any clear overlying skin changes or fluctuance.  I did do a quick bedside ultrasound which appeared to be a soft tissue mass.  However will plan on sending for formal ultrasound to better evaluate this area.  [place] is consistent with likely sebaceous cyst with infection. Discussed this with the patient. Area was I and D'd with large amount of purelent material. Will plan on discharging with prescription for antibiotics.  ____________________________________________   FINAL CLINICAL IMPRESSION(S) / ED DIAGNOSES  Abscess    Note: This dictation was prepared with Dragon dictation. Any transcriptional errors that result from this process are unintentional  Phineas Semen, MD 04/09/21 5110    Phineas Semen, MD 04/09/21 [phone]

## 2021-05-30 ENCOUNTER — Emergency Department
Admission: EM | Admit: 2021-05-30 | Discharge: 2021-05-30 | Disposition: A | Discharge status: Home / Self Care | Payer: Medicaid Other | Attending: Emergency Medicine | Admitting: Emergency Medicine

## 2021-05-30 ENCOUNTER — Other Ambulatory Visit: Payer: Self-pay

## 2021-05-30 DIAGNOSIS — I10 Essential (primary) hypertension: Secondary | ICD-10-CM | POA: Insufficient documentation

## 2021-05-30 DIAGNOSIS — N390 Urinary tract infection, site not specified: Secondary | ICD-10-CM | POA: Diagnosis not present

## 2021-05-30 DIAGNOSIS — Z20822 Contact with and (suspected) exposure to covid-19: Secondary | ICD-10-CM | POA: Insufficient documentation

## 2021-05-30 DIAGNOSIS — J020 Streptococcal pharyngitis: Secondary | ICD-10-CM | POA: Diagnosis not present

## 2021-05-30 DIAGNOSIS — R509 Fever, unspecified: Secondary | ICD-10-CM | POA: Diagnosis present

## 2021-05-30 LAB — CBC WITH DIFFERENTIAL/PLATELET
Abs Immature Granulocytes: 0.42 10*3/uL — ABNORMAL HIGH (ref 0.00–0.07)
Basophils Absolute: 0.1 10*3/uL (ref 0.0–0.1)
Basophils Relative: 0 %
Eosinophils Absolute: 0 10*3/uL (ref 0.0–0.5)
Eosinophils Relative: 0 %
HCT: 42.2 % (ref 36.0–46.0)
Hemoglobin: 13.7 g/dL (ref 12.0–15.0)
Immature Granulocytes: 2 %
Lymphocytes Relative: 9 %
Lymphs Abs: 2.3 10*3/uL (ref 0.7–4.0)
MCH: 29.8 pg (ref 26.0–34.0)
MCHC: 32.5 g/dL (ref 30.0–36.0)
MCV: 91.7 fL (ref 80.0–100.0)
Monocytes Absolute: 1.8 10*3/uL — ABNORMAL HIGH (ref 0.1–1.0)
Monocytes Relative: 7 %
Neutro Abs: 20.3 10*3/uL — ABNORMAL HIGH (ref 1.7–7.7)
Neutrophils Relative %: 82 %
Platelets: 310 10*3/uL (ref 150–400)
RBC: 4.6 MIL/uL (ref 3.87–5.11)
RDW: 14.3 % (ref 11.5–15.5)
WBC: 25 10*3/uL — ABNORMAL HIGH (ref 4.0–10.5)
nRBC: 0 % (ref 0.0–0.2)

## 2021-05-30 LAB — URINALYSIS, ROUTINE W REFLEX MICROSCOPIC
Glucose, UA: NEGATIVE mg/dL
Ketones, ur: 15 mg/dL — AB
Nitrite: POSITIVE — AB
Protein, ur: 100 mg/dL — AB
Specific Gravity, Urine: >1.03 — ABNORMAL HIGH (ref 1.005–1.030)
pH: 5 (ref 5.0–8.0)

## 2021-05-30 LAB — COMPREHENSIVE METABOLIC PANEL
ALT: 19 U/L (ref 0–44)
AST: 17 U/L (ref 15–41)
Albumin: 4.1 g/dL (ref 3.5–5.0)
Alkaline Phosphatase: 71 U/L (ref 38–126)
Anion gap: 6 (ref 5–15)
BUN: 11 mg/dL (ref 6–20)
CO2: 21 mmol/L — ABNORMAL LOW (ref 22–32)
Calcium: 8.6 mg/dL — ABNORMAL LOW (ref 8.9–10.3)
Chloride: 106 mmol/L (ref 98–111)
Creatinine, Ser: 0.9 mg/dL (ref 0.44–1.00)
GFR, Estimated: >60 mL/min (ref >60)
Glucose, Bld: 108 mg/dL — ABNORMAL HIGH (ref 70–99)
Potassium: 3.5 mmol/L (ref 3.5–5.1)
Sodium: 133 mmol/L — ABNORMAL LOW (ref 135–145)
Total Bilirubin: 0.8 mg/dL (ref 0.3–1.2)
Total Protein: 7.9 g/dL (ref 6.5–8.1)

## 2021-05-30 LAB — RESP PANEL BY RT-PCR (FLU A&B, COVID) ARPGX2
Influenza A by PCR: NEGATIVE
Influenza B by PCR: NEGATIVE
SARS Coronavirus 2 by RT PCR: NEGATIVE

## 2021-05-30 LAB — URINALYSIS, MICROSCOPIC (REFLEX)

## 2021-05-30 LAB — POC URINE PREG, ED: Preg Test, Ur: NEGATIVE

## 2021-05-30 LAB — GROUP A STREP BY PCR: Group A Strep by PCR: DETECTED — AB

## 2021-05-30 MED ORDER — FOSFOMYCIN TROMETHAMINE 3 G PO PACK
3.0000 g | PACK | Freq: Once | ORAL | Status: AC
  Administered 2021-05-30: 3 g via ORAL
  Filled 2021-05-30: qty 3

## 2021-05-30 MED ORDER — AMOXICILLIN-POT CLAVULANATE 875-125 MG PO TABS: 1.0000 | ORAL_TABLET | Freq: Two times a day (BID) | ORAL | 0 refills | Status: AC

## 2021-05-30 MED ORDER — DEXAMETHASONE 1 MG/ML PO CONC
10.0000 mg | Freq: Once | ORAL | Status: AC
  Administered 2021-05-30: 10 mg via ORAL
  Filled 2021-05-30: qty 10

## 2021-05-30 NOTE — ED Triage Notes (Signed)
Pt to ED for fever, sore throat, cough that started Friday.  Daughter here for same.  NAD noted. Took tylenol PTA

## 2021-05-30 NOTE — Discharge Instructions (Addendum)
If you develop worsening pain unable to tolerate eating or any other concerns return to the ER for repeat evaluation

## 2021-05-30 NOTE — ED Provider Notes (Signed)
[facility] Provider Note    Event Date/Time   First MD Initiated Contact with Patient 05/30/21 [phone]     (approximate)   History   Fever   HPI  Shannon Ochoa is a 41 y.o. female who reports a history of hypertension who comes in with concerns for viral symptoms.  Patient reports that she has been feeling unwell for the past 4 days.  She reports having a sore throat, fevers, body aches.  She states that she is lost her taste and smell.  She denies any coughing or shortness of breath.  States it is more the body aches and he not wanting to eat that it affected her.  She is here with her daughter who is also been sick.  Patient does have her COVID vaccines.  Reports she last had COVID when it first came out.  Reports some increased urination but attributes that to her blood pressure medication.  I reviewed OB note from May 2021 but appears that patient used to be on a Depo shot.   Physical Exam   Triage Vital Signs: ED Triage Vitals  Enc Vitals Group     BP 05/30/21 0747 (!) 143/105     Pulse Rate 05/30/21 0747 89     Resp 05/30/21 0747 18     Temp 05/30/21 0747 98.6 F (37 C)     Temp Source 05/30/21 0747 Oral     SpO2 05/30/21 0747 98 %     Weight 05/30/21 0748 180 lb (81.6 kg)     Height 05/30/21 0748 5\' 2"  (1.575 m)     Head Circumference --      Peak Flow --      Pain Score 05/30/21 0748 8     Pain Loc --      Pain Edu? --      Excl. in [place]? --     Most recent vital signs: Vitals:   05/30/21 0747  BP: (!) 143/105  Pulse: 89  Resp: 18  Temp: 98.6 F (37 C)  SpO2: 98%     General: Awake, no distress.  Well-appearing CV:  Good peripheral perfusion.  Resp:  Normal effort.  Abd:  No distention.  Soft and nontender Other:  Oropharynx with some exudates worse on the left tonsil but uvula is midline with tonsils equal in size.  She is able to range her neck.  TMs are clear bilaterally   ED Results / Procedures / Treatments    Labs (all labs ordered are listed, but only abnormal results are displayed) Labs Reviewed  RESP PANEL BY RT-PCR (FLU A&B, COVID) [id number]      PROCEDURES:  Critical Care performed: No  Procedures   MEDICATIONS ORDERED IN ED: Medications  dexamethasone (DECADRON) 1 MG/ML solution 10 mg (10 mg Oral Given 05/30/21 0901)     IMPRESSION / MDM / ASSESSMENT AND PLAN / ED COURSE  I reviewed the triage vital signs and the nursing notes.                             Patient with hypertension who comes in with sick symptoms Differential diagnosis includes, but is not limited to, flu, COVID, RSV.  Will get urine to evaluate for pregnancy, UTI.  Will get strep test given pus noted on tonsils.  No evidence of PTA or RPA on examination.  We will give a dose of steroids to help with her.  Patient has no coughing, no hypoxia unlikely pneumonia.  Given patient is a candidate for paxlovid I discussed with patient and she be wanting to take it if she is COVID-positive.  Patient does not have a baseline creatinine in the past 2 years therefore we will get some basic labs while waiting for COVID test.  CMP with normal kidney function, slightly low sodium CBC elevated white count no anemia COVID, flu, RSV all negative Strep test positive   Patient's urine also looks concerning for UTI.  We will give a dose of fosfomycin given the amoxicillin may not be as good it penetrated the urine.  We will add on urine culture.  Her white count is significantly elevated but she is had this previously and I am concerned that there is an active infection.  We will start her on some antibiotics given group A strep was detected.  No evidence of peritonsillar abscess upon examination but we discussed return precautions in regards to this.       FINAL CLINICAL IMPRESSION(S) / ED DIAGNOSES   Final diagnoses:  Urinary tract infection without hematuria, site unspecified  Strep throat     Rx / DC Orders   ED  Discharge Orders          Ordered    amoxicillin-clavulanate (AUGMENTIN) 875-125 MG tablet  2 times daily        05/30/21 1112             Note:  This document was prepared using Dragon voice recognition software and may include unintentional dictation errors.   Vanessa [place], MD 05/30/21 1113

## 2021-05-31 LAB — URINE CULTURE

## 2022-05-29 ENCOUNTER — Emergency Department: Payer: Medicaid Other

## 2022-05-29 ENCOUNTER — Encounter: Payer: Self-pay | Admitting: Emergency Medicine

## 2022-05-29 ENCOUNTER — Other Ambulatory Visit: Payer: Self-pay

## 2022-05-29 ENCOUNTER — Emergency Department
Admission: EM | Admit: 2022-05-29 | Discharge: 2022-05-29 | Disposition: A | Discharge status: Home / Self Care | Payer: Medicaid Other | Attending: Emergency Medicine | Admitting: Emergency Medicine

## 2022-05-29 DIAGNOSIS — X501XXA Overexertion from prolonged static or awkward postures, initial encounter: Secondary | ICD-10-CM | POA: Diagnosis not present

## 2022-05-29 DIAGNOSIS — Y93E1 Activity, personal bathing and showering: Secondary | ICD-10-CM | POA: Insufficient documentation

## 2022-05-29 DIAGNOSIS — S9031XA Contusion of right foot, initial encounter: Secondary | ICD-10-CM | POA: Diagnosis not present

## 2022-05-29 DIAGNOSIS — S99921A Unspecified injury of right foot, initial encounter: Secondary | ICD-10-CM

## 2022-05-29 MED ORDER — HYDROCODONE-ACETAMINOPHEN 5-325 MG PO TABS: 1.0000 | ORAL_TABLET | Freq: Four times a day (QID) | ORAL | 0 refills | Status: AC | PRN

## 2022-05-29 MED ORDER — OXYCODONE-ACETAMINOPHEN 5-325 MG PO TABS
2.0000 | ORAL_TABLET | Freq: Once | ORAL | Status: AC
  Administered 2022-05-29: 2 via ORAL
  Filled 2022-05-29: qty 2

## 2022-05-29 MED ORDER — IBUPROFEN 600 MG PO TABS: 600.0000 mg | ORAL_TABLET | Freq: Three times a day (TID) | ORAL | 0 refills | Status: AC | PRN

## 2022-05-29 MED ORDER — ONDANSETRON 4 MG PO TBDP
4.0000 mg | ORAL_TABLET | Freq: Once | ORAL | Status: AC
  Administered 2022-05-29: 4 mg via ORAL
  Filled 2022-05-29: qty 1

## 2022-05-29 NOTE — ED Triage Notes (Signed)
Pt presents via POV with complaints of R foot pain following a fall last night in the shower. Pt states that she slipped and landed on her foot. No LOC- didn't hit her head. Mild edema to the top of her R foot - no deformities noted.

## 2022-05-29 NOTE — ED Provider Notes (Signed)
[facility] Provider Note    Event Date/Time   First MD Initiated Contact with Patient 05/29/22 [phone]     (approximate)   History   Foot Injury   HPI  Shannon Ochoa is a 42 y.o. female here with right foot pain.  The patient states that she was in the shower yesterday with her child.  She tripped, falling backwards.  She twisted her right foot.  She has since had aching, throbbing, severe pain.  She said difficulty ambulating on it.  Denies any head injury or neck pain.  No other injuries.  No history of previous injuries to the area.  No distal numbness or weakness.     Physical Exam   Triage Vital Signs: ED Triage Vitals  Enc Vitals Group     BP 05/29/22 0638 (!) 150/107     Pulse Rate 05/29/22 0638 78     Resp 05/29/22 0638 20     Temp 05/29/22 0638 98.6 F (37 C)     Temp Source 05/29/22 0638 Oral     SpO2 05/29/22 0638 100 %     Weight 05/29/22 0643 180 lb (81.6 kg)     Height 05/29/22 0643 5\' 2"  (1.575 m)     Head Circumference --      Peak Flow --      Pain Score --      Pain Loc --      Pain Edu? --      Excl. in [place]? --     Most recent vital signs: Vitals:   05/29/22 0638  BP: (!) 150/107  Pulse: 78  Resp: 20  Temp: 98.6 F (37 C)  SpO2: 100%     General: Awake, no distress.  CV:  Good peripheral perfusion.  Resp:  Normal effort.  Abd:  No distention.  Other:  Moderate tenderness and edema with ecchymoses to the right lateral midfoot.  Significant midfoot tenderness to palpation.  No crepitance.  Distal neurovasculature is intact.   ED Results / Procedures / Treatments   Labs (all labs ordered are listed, but only abnormal results are displayed) Labs Reviewed - No data to display   EKG    RADIOLOGY DG foot right: Negative   I also independently reviewed and agree with radiologist interpretations.   PROCEDURES:  Critical Care performed: No    MEDICATIONS ORDERED IN ED: Medications   oxyCODONE-acetaminophen (PERCOCET/ROXICET) 5-325 MG per tablet 2 tablet (has no administration in time range)  ondansetron (ZOFRAN-ODT) disintegrating tablet 4 mg (has no administration in time range)     IMPRESSION / MDM / ASSESSMENT AND PLAN / ED COURSE  I reviewed the triage vital signs and the nursing notes.                              Differential diagnosis includes, but is not limited to, foot contusion, occult midfoot fracture, sprain/strain  Patient's presentation is most consistent with acute complicated illness / injury requiring diagnostic workup.  42 year old female here with pain to the right midfoot.  Plain films reviewed by me, and are negative.  However, the patient does have significant tenderness to palpation and swelling in the area.  She is having difficulty weightbearing.  Will treat for possible occult midfoot fracture with a cam walker, outpatient follow-up for repeat films in a week.  Will advise rest and elevation with analgesia.    FINAL CLINICAL IMPRESSION(S) /  ED DIAGNOSES   Final diagnoses:  Contusion of right foot, initial encounter  Injury of right foot, initial encounter     Rx / DC Orders   ED Discharge Orders          Ordered    ibuprofen (ADVIL) 600 MG tablet  Every 8 hours PRN        05/29/22 0754    HYDROcodone-acetaminophen (NORCO/VICODIN) 5-325 MG tablet  Every 6 hours PRN        05/29/22 0754             Note:  This document was prepared using Dragon voice recognition software and may include unintentional dictation errors.   Duffy Bruce, MD 05/29/22 [phone]

## 2022-05-29 NOTE — Discharge Instructions (Signed)
Wear the walker next week.  It is okay to take this off to shower.  Try to progressively put more weight on the foot.  If symptoms persist after 1 week, call the number above to set up an outpatient appointment for repeat x-ray.

## 2024-03-06 ENCOUNTER — Other Ambulatory Visit: Payer: Self-pay | Admitting: Family Medicine

## 2024-03-06 DIAGNOSIS — Z1231 Encounter for screening mammogram for malignant neoplasm of breast: Secondary | ICD-10-CM
